# Patient Record
Sex: Female | Born: 1975 | Race: White | Hispanic: No | Marital: Married | Smoking: Former smoker
Health system: Southern US, Community
[De-identification: ages and names within clinical notes are randomized; demographics above are authoritative.]

## PROBLEM LIST (undated history)

## (undated) DIAGNOSIS — N946 Dysmenorrhea, unspecified: Secondary | ICD-10-CM

## (undated) DIAGNOSIS — K219 Gastro-esophageal reflux disease without esophagitis: Secondary | ICD-10-CM

## (undated) DIAGNOSIS — I10 Essential (primary) hypertension: Secondary | ICD-10-CM

## (undated) DIAGNOSIS — E0590 Thyrotoxicosis, unspecified without thyrotoxic crisis or storm: Secondary | ICD-10-CM

## (undated) DIAGNOSIS — E785 Hyperlipidemia, unspecified: Secondary | ICD-10-CM

## (undated) DIAGNOSIS — E890 Postprocedural hypothyroidism: Secondary | ICD-10-CM

## (undated) DIAGNOSIS — N8003 Adenomyosis of the uterus: Secondary | ICD-10-CM

## (undated) DIAGNOSIS — T7840XA Allergy, unspecified, initial encounter: Secondary | ICD-10-CM

## (undated) DIAGNOSIS — N8000 Endometriosis of the uterus, unspecified: Secondary | ICD-10-CM

## (undated) DIAGNOSIS — N920 Excessive and frequent menstruation with regular cycle: Secondary | ICD-10-CM

## (undated) DIAGNOSIS — Z8639 Personal history of other endocrine, nutritional and metabolic disease: Secondary | ICD-10-CM

## (undated) HISTORY — DX: Allergy, unspecified, initial encounter: T78.40XA

## (undated) HISTORY — PX: COLONOSCOPY: SHX174

## (undated) HISTORY — DX: Hyperlipidemia, unspecified: E78.5

## (undated) HISTORY — DX: Essential (primary) hypertension: I10

## (undated) HISTORY — PX: TUBAL LIGATION: SHX77

## (undated) HISTORY — DX: Gastro-esophageal reflux disease without esophagitis: K21.9

---

## 2006-05-15 ENCOUNTER — Ambulatory Visit (HOSPITAL_COMMUNITY): Admission: RE | Admit: 2006-05-15 | Discharge: 2006-05-15 | Payer: Self-pay | Admitting: Obstetrics and Gynecology

## 2006-07-17 ENCOUNTER — Inpatient Hospital Stay (HOSPITAL_COMMUNITY): Admission: AD | Admit: 2006-07-17 | Discharge: 2006-07-20 | Payer: Self-pay | Admitting: Obstetrics and Gynecology

## 2006-08-23 ENCOUNTER — Ambulatory Visit (HOSPITAL_COMMUNITY): Admission: RE | Admit: 2006-08-23 | Discharge: 2006-08-23 | Payer: Self-pay | Admitting: Obstetrics and Gynecology

## 2006-08-23 HISTORY — PX: LAPAROSCOPIC TUBAL LIGATION: SUR803

## 2008-02-12 ENCOUNTER — Emergency Department (HOSPITAL_COMMUNITY): Admission: EM | Admit: 2008-02-12 | Discharge: 2008-02-12 | Payer: Self-pay | Admitting: Emergency Medicine

## 2008-07-20 ENCOUNTER — Emergency Department (HOSPITAL_COMMUNITY): Admission: EM | Admit: 2008-07-20 | Discharge: 2008-07-21 | Payer: Self-pay | Admitting: Emergency Medicine

## 2009-06-29 ENCOUNTER — Encounter: Admission: RE | Admit: 2009-06-29 | Discharge: 2009-06-29 | Payer: Self-pay | Admitting: Internal Medicine

## 2010-01-04 LAB — HM COLONOSCOPY

## 2010-08-19 LAB — URINALYSIS, ROUTINE W REFLEX MICROSCOPIC
Glucose, UA: NEGATIVE mg/dL
Hgb urine dipstick: NEGATIVE
Ketones, ur: NEGATIVE mg/dL
Nitrite: NEGATIVE
Protein, ur: 30 mg/dL — AB
Specific Gravity, Urine: 1.025 (ref 1.005–1.030)
pH: 5.5 (ref 5.0–8.0)

## 2010-08-19 LAB — POCT I-STAT, CHEM 8
BUN: 8 mg/dL (ref 6–23)
Calcium, Ion: 1.12 mmol/L (ref 1.12–1.32)
Creatinine, Ser: 0.9 mg/dL (ref 0.4–1.2)
Glucose, Bld: 191 mg/dL — ABNORMAL HIGH (ref 70–99)
Hemoglobin: 17 g/dL — ABNORMAL HIGH (ref 12.0–15.0)
Sodium: 139 mEq/L (ref 135–145)
TCO2: 23 mmol/L (ref 0–100)

## 2010-08-19 LAB — URINE MICROSCOPIC-ADD ON

## 2010-09-24 NOTE — Discharge Summary (Signed)
NAMEANDERIA, Connie NO.:  [id number]   MEDICAL RECORD NO.:  [id number]          PATIENT TYPE:  INP   LOCATION:                                FACILITY:  [facility]   PHYSICIAN:  Duke Salvia. Marcelle Overlie, M.D.DATE OF BIRTH:  06-12-1975   DATE OF ADMISSION:  DATE OF DISCHARGE:  07/20/2006                               DISCHARGE SUMMARY   ADMITTING DIAGNOSES:  1. Intrauterine pregnancy at term.  2. Breech presentation.  3. Elevation in maternal blood pressure.   DISCHARGE DIAGNOSES:  1. Status post low transverse cesarean section.  2. Viable female infant.   PROCEDURE:  Primary low transverse cesarean section.   REASON FOR ADMISSION:  Please see written H&P.   HOSPITAL COURSE:  The patient is 35 year old primigravida who was  admitted to [facility] for scheduled cesarean section.  Baby was known to be in the breech presentation.  The patient also had  been seen in the office for blood pressure was noted to be 140/90s with  1+ proteinuria.  PIH panel was drawn which was within normal limits.  Decision was made to proceed with a primary low transverse cesarean  section.  The patient was then transferred to operating room where  spinal anesthesia was administered without difficulty.  A low transverse  incision was made with delivery of a viable female infant weighing 7  pounds 1 ounce.  Apgars of 9 at 1 minutes, 10 at 5 minutes.  The patient  tolerated procedure well and taken to the recovery room in stable  condition.  On postoperative day #1 the patient was without complaint.  She denied dizziness, headache or blurred vision.  Vital signs were  stable.  Blood pressure 111/66 to 108/64.  Abdomen soft.  Fundus firm  and nontender.  Abdominal dressing was noted to be clean, dry and  intact. Laboratory findings revealed hemoglobin of 7.6, platelet count  of 220,000, WBC count of 13.7. CBC was drawn in approximately 6  additional hours and hemoglobin was  found to be stable at 7.4. On  postoperative day #2 the patient was without complaint.  She denied  dizziness or headache or blurred vision.  Vital signs were stable.  Blood pressure 130/91 to 119/80.  Deep tendon reflexes were 1+ without  clonus.  Abdomen soft with good return of bowel function.  Fundus firm  and nontender.  Abdominal dressing had been removed revealing an  incision that was clean, dry and intact.  The patient was ambulating  well.  NPIH labs were scheduled for in the morning. On postoperative day  #3 the patient without complaint.  Denied dizziness, headache or blurred  vision.  Vital signs were stable.  Blood pressure stable.  Abdomen soft.  Fundus firm and nontender.  Incision was clean, dry and intact.  Laboratory findings revealed hemoglobin of 6.7, platelet count of  240,000.  Liver function tests were within normal limits.  Discharge  instructions were reviewed and the patient was later discharged home.   CONDITION ON DISCHARGE:  Stable. Diet regular as tolerated.   ACTIVITY:  No heavy lifting, no  driving x2 weeks, no vaginal entry.   FOLLOW UP:  Patient to follow up in the office in 2-3 days for blood  pressure check and incision check.  She is to call for temperature  greater than 100 degrees, persistent nausea, vomiting, heavy vaginal  bleeding and/or redness or drainage from incisional site.  The patient  was also instructed to call for headache, blurred vision or right upper  quadrant pain.   DISCHARGE MEDICATIONS:  1. Ferrous sulfate 325 mg one p.o. b.i.d.  2. Percocet 5 access 325 #30 1 p.o. b.i.d. 4-6 p.r.n.  3. Motrin 600 mg every 6 hours.  4. Prenatal vitamins one p.o. daily.      Julio Sicks, N.P.      Richard M. Marcelle Overlie, M.D.  Electronically Signed    CC/MEDQ  D:  08/14/2006  T:  08/14/2006  Job:  [id number]

## 2010-09-24 NOTE — Op Note (Signed)
NAMESRAVYA, [name] NO.:  [id number]   MEDICAL RECORD NO.:  [id number]          PATIENT TYPE:  INP   LOCATION:  9146                          [facility]ACILITY:  [facility]   PHYSICIAN:  Juluis Mire, M.D.   DATE OF BIRTH:  03/04/1976   DATE OF PROCEDURE:  07/17/2006  DATE OF DISCHARGE:                               OPERATIVE REPORT   PREOPERATIVE DIAGNOSIS:  Intrauterine pregnancy at 37 weeks with mild  pregnancy-induced hypertension and breech presentation.   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   OPERATIVE PROCEDURE:  Was low transverse cesarean section.   SURGEON:  Juluis Mire, M.D.   ANESTHESIA:  Spinal.   ESTIMATED BLOOD LOSS:  500-600 mL.   PACKS AND DRAINS:  None.   BLOOD REPLACED:  None.   COMPLICATIONS:  Were none.   INDICATIONS:  A 35 year old primigravida female at 58 weeks.  Presented  to the office with blood pressures of 140/90 and 1+ proteinuria.  She  was sent to triage were she continued to have elevated blood pressure.  Her laboratories were within normal limits. Ultrasound confirmed a  persistent breech presentation.  In view of the elevated blood pressures  at term with breech presentation decision was to proceed with primary  cesarean section.  The risks were discussed with the risk of infection,  the risks of hemorrhage could require transfusion with the risk of AIDS  or hepatitis, the risk of injury to adjacent organs including bladder,  bowel, ureters that could require further exploratory surgery, risk of  deep venous thrombosis and pulmonary embolus, the risk of prematurity  also discussed.   DESCRIPTION OF PROCEDURE:  The patient was taken to OR, placed supine  position with left lateral tilt.  After satisfactory level of spinal  anesthesia obtained, abdomen was  prepped out with Betadine and draped in  sterile field.  A low transverse skin incision made with knife.  The  incision was extended through subcutaneous tissue.  Fascia was entered  sharply and incision to fascia extended laterally.  Fascia taken off the  muscles superiorly and inferiorly.  Rectus muscles were separated in  midline.  The peritoneum was entered sharply and incision of perineum  extended both superiorly and inferiorly.  Low transverse bladder flap  was developed, a low transverse uterine incision begun with a knife and  extended laterally using manual traction.  The infant presented in the  breech presentation and was delivered in usual manner.  The infant was a  viable female.  Apgars were 9/10.  Baby weighed 7 pounds 1 ounce.  PH  was 7.34.  Placenta was delivered manually and sent for review.  Uterus  was exteriorized for closure.  At this point time she had a unicornuate  uterus with rudimentary left knee uterine horn. She had ovaries and  tubes on both sides.  At this point in time uterus closed with locking  suture 0 chromic using two-layer closure technique.  We had good  hemostasis and clear urine output.  Uterus returned to abdominal cavity.  We irrigated the pelvis.  The peritoneum was closed with a suture of 3-0  Vicryl.  Fascia was closed running suture of 0 PDS.  Skin was closed  staples and Steri-Strips. Sponge, instrument, and needle count was  correct by circulating nurse x2.  Foley catheter remained clear at time  of closure.  The patient tolerated the procedure well and returned to  the recovery room in good condition.      Juluis Mire, M.D.  Electronically Signed     JSM/MEDQ  D:  07/17/2006  T:  07/17/2006  Job:  [id number]

## 2010-09-24 NOTE — H&P (Signed)
NAME:  [name], [name] NO.:  [id number]   MEDICAL RECORD NO.:  [id number]          PATIENT TYPE:  AMB   LOCATION:  SDC                           [facility]:  [facility]   PHYSICIAN:  Juluis Mire, M.D.   DATE OF BIRTH:  April 09, 1976   DATE OF ADMISSION:  DATE OF DISCHARGE:                              HISTORY & PHYSICAL   The patient is a 35 year old, gravida 1, para 1, married female who  presents for bilateral tubal ligation.  Alternatives and forms of birth  control have been discussed.  The potential irreversibility of  sterilization was explained.  A failure rate of 1 in 200 is quoted.  It  can be in the form of ectopic pregnancy requiring further surgical  management.   PAST MEDICAL HISTORY:  Usual childhood diseases.  No significant  sequelae.   PAST SURGICAL HISTORY:  She had a recent cesarean section.   FAMILY HISTORY:  Noncontributory.   SOCIAL HISTORY:  No tobacco or alcohol use.   REVIEW OF SYSTEMS:  Noncontributory.   PHYSICAL EXAMINATION:  GENERAL:  The patient is afebrile.  Stable vital  signs.  HEENT:  The patient is normocephalic.  Pupils equal, round, and reactive  to light and accommodation.  Extraocular movements were intact.  Sclerae  and conjunctivae clear.  Oropharynx clear.  NECK:  Without thyromegaly.  BREASTS:  Glandular but no discrete masses.  ABDOMEN:  Well-healed low transverse incision.  No masses or  organomegaly.  PELVIC:  Normal external genitalia.  Vaginal mucosa is clear.  Cervix  unremarkable.  Uterus of normal size and shape.  Adnexa free of masses  or tenderness.  EXTREMITIES:  Trace edema.  NEUROLOGIC:  Grossly within normal limits.   IMPRESSION:  Desires sterility.   PLAN:  The patient will undergo laparoscopic bilateral tubal ligation.  The risks of surgery have been discussed including the risk of  infection, the risk of hemorrhage that could require transfusion, the  risk of AIDS or hepatitis, risk of injury to  adjacent organs including  bladder, bowel, or ureters that could require further exploratory  surgery, risk of deep venous thrombosis and pulmonary emboli.  The  patient voices understanding of risk and issues.      Juluis Mire, M.D.  Electronically Signed    JSM/MEDQ  D:  08/23/2006  T:  08/23/2006  Job:  [id number]

## 2010-09-24 NOTE — Op Note (Signed)
NAMEMARIDEE, SLAPE              ACCOUNT NO.:  [id number]   MEDICAL RECORD NO.:  [id number]          PATIENT TYPE:  AMB   LOCATION:  SDC                           [facility]   PHYSICIAN:  Juluis Mire, M.D.   DATE OF BIRTH:  1976/04/19   DATE OF PROCEDURE:  08/23/2006  DATE OF DISCHARGE:                               OPERATIVE REPORT   PREOPERATIVE DIAGNOSIS:  Desires sterility.   POSTOPERATIVE DIAGNOSIS:  Desires sterility.   PROCEDURE:  Laparoscopy with bilateral tubal fulguration.   SURGEON:  Juluis Mire, M.D.   ANESTHESIA:  General.   ESTIMATED BLOOD LOSS:  Minimal.   PACKS AND DRAINS:  None.   INTRAOPERATIVE BLOOD PLACED:  None.   COMPLICATIONS:  None.   INDICATIONS:  Dictated in history and physical.   DESCRIPTION OF PROCEDURE:  Patient was taken to the OR and placed in the  supine position.  After satisfactory level of general endotracheal  anesthesia obtained, the patient was placed in the dorsal lithotomy  position using the Allen stirrups.  The abdomen, perineum and vagina  were prepped out with Betadine.  Bladder was emptied by in-and-out  catheterization.  The Hulka tenaculum was put in place and secured.  Patient draped in sterile field.  Subumbilical incision made with a  knife.  The Veress needle was introduced in the abdominal cavity.  Abdomen inflated with approximately 3 liters of carbon dioxide.  The  operating laparoscope was then introduced.  There was no evidence of  injury to adjacent organs.  We did put a 5 mm trocar in the suprapubic  area.  Visualization revealed normal appendix.  Upper abdomen including  liver and tip of the gallbladder were clear.  Both lateral gutters were  clear.  She had a unicorned uterus with a rudimentary horn on the left.  Left have a rudimentary tube, ovary appeared to be normal.  She had a  functional cyst on the left ovary, right ovary was clear.  Next, using  the bipolar, mid segment of each tube was  identified and cauterized for  a distance of approximately 3 cm.  Coagulation was continued to  resistance read 0 on the meter.  We then recoagulated the same segments  of tube completely desiccating the tube.  The cautery did extend out  into the mesosalpinx.  At the end of procedure we had good hemostasis.  Both tubes were adequately coagulated.  We emptied the abdomen of its  carbon dioxide.  All trocars removed.  Subumbilical incision was closed  interrupted subcuticulars of 4-0 Vicryl.  The suprapubic incision was  closed with Dermabond.  At this point in time the Hulka tenaculum was  then removed.  The patient was taken out of the dorsal position.  Once  alert and extubated, transferred to the recovery room in good condition.  Sponge, instrument and needle count reported as correct by circulating  nurse.      Juluis Mire, M.D.  Electronically Signed     JSM/MEDQ  D:  08/23/2006  T:  08/23/2006  Job:  [id number]

## 2011-03-10 ENCOUNTER — Ambulatory Visit (HOSPITAL_COMMUNITY): Admission: RE | Admit: 2011-03-10 | Payer: Self-pay | Source: Ambulatory Visit | Admitting: Obstetrics and Gynecology

## 2011-03-10 ENCOUNTER — Encounter (HOSPITAL_COMMUNITY): Admission: RE | Payer: Self-pay | Source: Ambulatory Visit

## 2011-03-10 SURGERY — HYSTERECTOMY, VAGINAL, LAPAROSCOPY-ASSISTED
Anesthesia: General

## 2013-01-14 ENCOUNTER — Other Ambulatory Visit: Payer: Self-pay | Admitting: Family Medicine

## 2013-01-14 ENCOUNTER — Ambulatory Visit (INDEPENDENT_AMBULATORY_CARE_PROVIDER_SITE_OTHER): Payer: BC Managed Care – PPO | Admitting: Family Medicine

## 2013-01-14 ENCOUNTER — Ambulatory Visit: Payer: BC Managed Care – PPO

## 2013-01-14 VITALS — BP 110/70 | HR 93 | Temp 99.6°F | Resp 16 | Ht 64.2 in | Wt 166.8 lb

## 2013-01-14 DIAGNOSIS — R221 Localized swelling, mass and lump, neck: Secondary | ICD-10-CM

## 2013-01-14 DIAGNOSIS — R635 Abnormal weight gain: Secondary | ICD-10-CM

## 2013-01-14 DIAGNOSIS — R220 Localized swelling, mass and lump, head: Secondary | ICD-10-CM

## 2013-01-14 DIAGNOSIS — J029 Acute pharyngitis, unspecified: Secondary | ICD-10-CM

## 2013-01-14 DIAGNOSIS — R059 Cough, unspecified: Secondary | ICD-10-CM

## 2013-01-14 DIAGNOSIS — R05 Cough: Secondary | ICD-10-CM

## 2013-01-14 LAB — POCT CBC
Granulocyte percent: 60.8 % (ref 37–80)
HCT, POC: 42.9 % (ref 37.7–47.9)
Hemoglobin: 13.7 g/dL (ref 12.2–16.2)
Lymph, poc: 3 (ref 0.6–3.4)
MCH, POC: 30.1 pg (ref 27–31.2)
MCHC: 31.9 g/dL (ref 31.8–35.4)
MCV: 94.3 fL (ref 80–97)
MID (cbc): 0.6 (ref 0–0.9)
MPV: 7.7 fL (ref 0–99.8)
POC Granulocyte: 5.6 (ref 2–6.9)
POC LYMPH PERCENT: 32.4 %L (ref 10–50)
POC MID %: 6.8 % (ref 0–12)
Platelet Count, POC: 287 10*3/uL (ref 142–424)
RBC: 4.55 M/uL (ref 4.04–5.48)
RDW, POC: 13.2 %
WBC: 9.2 10*3/uL (ref 4.6–10.2)

## 2013-01-14 LAB — POCT RAPID STREP A (OFFICE): Rapid Strep A Screen: NEGATIVE

## 2013-01-14 MED ORDER — AMOXICILLIN 875 MG PO TABS: 875.0000 mg | ORAL_TABLET | Freq: Two times a day (BID) | ORAL | Status: DC

## 2013-01-14 MED ORDER — HYDROCODONE-HOMATROPINE 5-1.5 MG/5ML PO SYRP: 5.0000 mL | ORAL_SOLUTION | Freq: Every evening | ORAL | Status: DC | PRN

## 2013-01-14 NOTE — Progress Notes (Signed)
[facility]:  Office Visit  Chief Complaint:  Chief Complaint  Patient presents with  . knot on front of neck    noticed this morning, hurts when swallows, pain when touch    HPI: [name] is a 37 y.o. female who complains of 1 day history of neck nodule.  She is here because has had difficulty swallowing, Denies fevers or chills, + sore throat. Denies facial or ear pain, + clear cough.  10 lb weigth gain in 2-3 months , quit smoking 3 months ago.  No drainage, + clears her throat alot, PND.  No recent travels , + sick contact, Her husbnad was sick with URI sxs Denies SOB, CP, wheezing, voice changes  History reviewed. No pertinent past medical history. Past Surgical History  Procedure Laterality Date  . Cesarean section     History   Social History  . Marital Status: Single    Spouse Name: N/A    Number of Children: N/A  . Years of Education: N/A   Social History Main Topics  . Smoking status: Former Games developer  . Smokeless tobacco: Former Neurosurgeon    Quit date: 10/14/2012  . Alcohol Use: 3.0 oz/week    6 drink(s) per week  . Drug Use: None  . Sexual Activity: Yes    Birth Control/ Protection: None   Other Topics Concern  . None   Social History Narrative  . None   Family History  Problem Relation Age of Onset  . Diabetes Father    No Known Allergies Prior to Admission medications   Not on File     ROS: The patient denies fevers, chills, night sweats, unintentional weight loss, chest pain, palpitations, wheezing, dyspnea on exertion, nausea, vomiting, abdominal pain, dysuria, hematuria, melena, numbness, weakness, or tingling.   All other systems have been reviewed and were otherwise negative with the exception of those mentioned in the HPI and as above.    PHYSICAL EXAM: Filed Vitals:   01/14/13 1306  BP: 110/70  Pulse: 93  Temp: 99.6 F (37.6 C)  Resp: 16   Filed Vitals:   01/14/13 1306  Height: 5' 4.2" (1.631 m)  Weight:  166 lb 12.8 oz (75.66 kg)   Body mass index is 28.44 kg/(m^2).  General: Alert, no acute distress HEENT:  Normocephalic, atraumatic, oropharynx patent. EOMI, PERRLA + soft mobile throat nodule at cricoid. Tm nl. No exudates, tonsils nl. No facial tenderness.  Cardiovascular:  Regular rate and rhythm, no rubs murmurs or gallops.  No Carotid bruits, radial pulse intact. No pedal edema.  Respiratory: Clear to auscultation bilaterally.  No wheezes, rales, or rhonchi.  No cyanosis, no use of accessory musculature GI: No organomegaly, abdomen is soft and non-tender, positive bowel sounds.  No masses. Skin: No rashes. Neurologic: Facial musculature symmetric. Psychiatric: Patient is appropriate throughout our interaction. Lymphatic: No cervical lymphadenopathy Musculoskeletal: Gait intact.     LABS: Results for orders placed in visit on 01/14/13  POCT CBC      Result Value Range   WBC 9.2  4.6 - 10.2 K/uL   Lymph, poc 3.0  0.6 - 3.4   POC LYMPH PERCENT 32.4  10 - 50 %L   MID (cbc) 0.6  0 - 0.9   POC MID % 6.8  0 - 12 %M   POC Granulocyte 5.6  2 - 6.9   Granulocyte percent 60.8  37 - 80 %G   RBC 4.55  4.04 - 5.48 M/uL  Hemoglobin 13.7  12.2 - 16.2 g/dL   HCT, POC 16.1  09.6 - 47.9 %   MCV 94.3  80 - 97 fL   MCH, POC 30.1  27 - 31.2 pg   MCHC 31.9  31.8 - 35.4 g/dL   RDW, POC 04.5     Platelet Count, POC 287  142 - 424 K/uL   MPV 7.7  0 - 99.8 fL  POCT RAPID STREP A (OFFICE)      Result Value Range   Rapid Strep A Screen Negative  Negative     EKG/XRAY:   Primary read interpreted by Dr. Conley Rolls at [facility]. No fx/dislocation No obvious soft tissue mass or edema or air    ASSESSMENT/PLAN: Encounter Diagnoses  Name Primary?  . Neck nodule Yes  . Pharyngitis   . Weight gain   . Cough    Rx Amoxacillin Rx Hydromet Throat cx pending F/u in  7 days, if no improvement then will get [place] of neck Work note given Go to ER prn Gross sideeffects, risk and benefits, and alternatives  of medications d/w patient. Patient is aware that all medications have potential sideeffects and we are unable to predict every sideeffect or drug-drug interaction that may occur.  Connie Cuccia PHUONG, [name] 01/14/2013 3:47 PM    LM to inquire how she is doing, throat cx negative so far.

## 2013-01-15 LAB — TSH: TSH: <0.008 u[IU]/mL — ABNORMAL LOW (ref 0.350–4.500)

## 2013-01-16 ENCOUNTER — Encounter: Payer: Self-pay | Admitting: Family Medicine

## 2013-01-16 LAB — CULTURE, GROUP A STREP: Organism ID, Bacteria: NORMAL

## 2013-01-17 ENCOUNTER — Telehealth: Payer: Self-pay | Admitting: *Deleted

## 2013-01-17 ENCOUNTER — Other Ambulatory Visit: Payer: Self-pay | Admitting: Family Medicine

## 2013-01-17 ENCOUNTER — Telehealth: Payer: Self-pay

## 2013-01-17 DIAGNOSIS — R221 Localized swelling, mass and lump, neck: Secondary | ICD-10-CM

## 2013-01-17 DIAGNOSIS — R7989 Other specified abnormal findings of blood chemistry: Secondary | ICD-10-CM

## 2013-01-17 LAB — T4, FREE: Free T4: 1.5 ng/dL (ref 0.80–1.80)

## 2013-01-17 LAB — T3: T3, Total: 191 ng/dL (ref 80.0–204.0)

## 2013-01-17 NOTE — Telephone Encounter (Signed)
Called and informed patient of abnormal TSH level and that Dr. Conley Rolls would like to add additional lab tests and schedule and ultrasound. Patient verbalized understanding. Patient was also asked how she was feeling and she stated that the lump in her neck is smaller, but she still feels fatigued.

## 2013-01-17 NOTE — Telephone Encounter (Signed)
Called [place] and spoke with [place]. Per Dr. Conley Rolls, added on a Free T4, T3, and a thyroid peroxidase antibody.

## 2013-01-18 ENCOUNTER — Ambulatory Visit
Admission: RE | Admit: 2013-01-18 | Discharge: 2013-01-18 | Disposition: A | Discharge status: Home / Self Care | Payer: PRIVATE HEALTH INSURANCE | Source: Ambulatory Visit | Attending: Family Medicine | Admitting: Family Medicine

## 2013-01-18 ENCOUNTER — Telehealth: Payer: Self-pay | Admitting: Family Medicine

## 2013-01-18 DIAGNOSIS — R221 Localized swelling, mass and lump, neck: Secondary | ICD-10-CM

## 2013-01-18 LAB — THYROID PEROXIDASE ANTIBODY: Thyroperoxidase Ab SerPl-aCnc: <10 IU/mL (ref <35.0)

## 2013-01-18 NOTE — Telephone Encounter (Signed)
Spoke to patient about added labs and also [place] results

## 2013-01-24 ENCOUNTER — Other Ambulatory Visit: Payer: Self-pay | Admitting: Family Medicine

## 2013-01-24 DIAGNOSIS — E041 Nontoxic single thyroid nodule: Secondary | ICD-10-CM

## 2013-06-04 ENCOUNTER — Ambulatory Visit: Payer: BC Managed Care – PPO | Admitting: Family

## 2013-06-13 ENCOUNTER — Ambulatory Visit (INDEPENDENT_AMBULATORY_CARE_PROVIDER_SITE_OTHER): Payer: Commercial Managed Care - PPO | Admitting: Family

## 2013-06-13 ENCOUNTER — Encounter (INDEPENDENT_AMBULATORY_CARE_PROVIDER_SITE_OTHER): Payer: Self-pay

## 2013-06-13 ENCOUNTER — Encounter: Payer: Self-pay | Admitting: Family

## 2013-06-13 VITALS — BP 122/82 | HR 80 | Temp 98.2°F | Resp 16 | Ht 62.0 in | Wt 166.0 lb

## 2013-06-13 DIAGNOSIS — E041 Nontoxic single thyroid nodule: Secondary | ICD-10-CM

## 2013-06-13 LAB — CBC WITH DIFFERENTIAL/PLATELET
BASOS ABS: 0 10*3/uL (ref 0.0–0.1)
Basophils Relative: 0.4 % (ref 0.0–3.0)
EOS ABS: 0.3 10*3/uL (ref 0.0–0.7)
EOS PCT: 3.5 % (ref 0.0–5.0)
HEMATOCRIT: 41.8 % (ref 36.0–46.0)
Hemoglobin: 14 g/dL (ref 12.0–15.0)
LYMPHS ABS: 1.8 10*3/uL (ref 0.7–4.0)
Lymphocytes Relative: 22.7 % (ref 12.0–46.0)
MCHC: 33.6 g/dL (ref 30.0–36.0)
MCV: 90.7 fl (ref 78.0–100.0)
Monocytes Absolute: 0.5 10*3/uL (ref 0.1–1.0)
Monocytes Relative: 7 % (ref 3.0–12.0)
Neutro Abs: 5.2 10*3/uL (ref 1.4–7.7)
Neutrophils Relative %: 66.4 % (ref 43.0–77.0)
PLATELETS: 281 10*3/uL (ref 150.0–400.0)
RBC: 4.61 Mil/uL (ref 3.87–5.11)
RDW: 12.9 % (ref 11.5–14.6)
WBC: 7.8 10*3/uL (ref 4.5–10.5)

## 2013-06-13 LAB — T3, FREE: T3 FREE: 4 pg/mL (ref 2.3–4.2)

## 2013-06-13 LAB — TSH: TSH: 0.06 u[IU]/mL — ABNORMAL LOW (ref 0.35–5.50)

## 2013-06-13 LAB — T4, FREE: Free T4: 0.97 ng/dL (ref 0.60–1.60)

## 2013-06-13 NOTE — Patient Instructions (Signed)
1. Refer to Endocrinology 2. Biopsy Thyroid 3. Follow-up for CPX

## 2013-06-13 NOTE — Progress Notes (Signed)
Pre visit review using our clinic review tool, if applicable. No additional management support is needed unless otherwise documented below in the visit note. 

## 2013-06-13 NOTE — Progress Notes (Signed)
   Subjective:    Patient ID: [name], female    DOB: 21-Mar-1976, 38 y.o.   MRN: [id number]  HPI 38 year old white female, nonsmoker, is in today to be established. She has a history of a thyroid nodule that was identified in September 2014. Due to lack of insurance, she reports that she can followup on the lesion for biopsy. She is here today to get that scheduled. Had a normal thyroid panel at that time. She denies any chest pain, palpitations, heat or cold intolerance. Last physical unknown.   Review of Systems  Constitutional: Negative.   HENT: Negative.   Respiratory: Negative.   Cardiovascular: Negative.   Gastrointestinal: Negative.   Endocrine: Negative.   Genitourinary: Negative.   Musculoskeletal: Negative.   Skin: Negative.   Neurological: Negative.   Hematological: Negative.   Psychiatric/Behavioral: Negative.        Objective:   Physical Exam  Constitutional: She is oriented to person, place, and time. She appears well-developed and well-nourished.  HENT:  Head: Normocephalic.  Right Ear: External ear normal.  Left Ear: External ear normal.  Nose: Nose normal.  Mouth/Throat: Oropharynx is clear and moist.  Eyes: Conjunctivae are normal. Pupils are equal, round, and reactive to light.  Neck: Normal range of motion. Neck supple.  Cardiovascular: Normal rate, regular rhythm and normal heart sounds.   Pulmonary/Chest: Effort normal and breath sounds normal.  Abdominal: Soft. Bowel sounds are normal.  Musculoskeletal: Normal range of motion.  Neurological: She is alert and oriented to person, place, and time. She has normal reflexes.  Skin: Skin is warm and dry.  Psychiatric: She has a normal mood and affect.          Assessment & Plan:  [name] was seen today for new pt to establish and thyroid nodule.  Diagnoses and associated orders for this visit:  Thyroid nodule - US Thyroid Biopsy; Future - TSH - T3, Free - T4, Free - CBC with  Differential    will follow the patient pending results. Refer to endocrinology.

## 2013-06-13 NOTE — Progress Notes (Signed)
   Subjective:    Patient ID: [name], female    DOB: [birth year], 38 y.o.   MRN: [id number]  HPI    Review of Systems     Objective:   Physical Exam  Neck: Thyromegaly present.  Palpable mass on the thyroid. Nontender.           Assessment & Plan:

## 2013-06-19 ENCOUNTER — Ambulatory Visit: Payer: Commercial Managed Care - PPO | Admitting: Endocrinology

## 2013-06-25 ENCOUNTER — Ambulatory Visit
Admission: RE | Admit: 2013-06-25 | Discharge: 2013-06-25 | Disposition: A | Discharge status: Home / Self Care | Payer: Commercial Managed Care - PPO | Source: Ambulatory Visit | Attending: Family | Admitting: Family

## 2013-06-25 ENCOUNTER — Other Ambulatory Visit (HOSPITAL_COMMUNITY)
Admission: RE | Admit: 2013-06-25 | Discharge: 2013-06-25 | Disposition: A | Discharge status: Home / Self Care | Payer: Commercial Managed Care - PPO | Source: Ambulatory Visit | Attending: Interventional Radiology | Admitting: Interventional Radiology

## 2013-06-25 DIAGNOSIS — E049 Nontoxic goiter, unspecified: Secondary | ICD-10-CM | POA: Insufficient documentation

## 2013-06-25 DIAGNOSIS — E041 Nontoxic single thyroid nodule: Secondary | ICD-10-CM

## 2013-06-28 ENCOUNTER — Telehealth: Payer: Self-pay | Admitting: Family

## 2013-06-28 ENCOUNTER — Other Ambulatory Visit: Payer: Self-pay | Admitting: Family

## 2013-06-28 DIAGNOSIS — E0789 Other specified disorders of thyroid: Secondary | ICD-10-CM

## 2013-06-28 NOTE — Telephone Encounter (Signed)
Pt returning your call. pls call °

## 2013-07-01 NOTE — Telephone Encounter (Signed)
Spoke with pt about [place] and she has an appt with Endo tomorrow

## 2013-07-02 ENCOUNTER — Encounter: Payer: Self-pay | Admitting: Endocrinology

## 2013-07-02 ENCOUNTER — Ambulatory Visit (INDEPENDENT_AMBULATORY_CARE_PROVIDER_SITE_OTHER): Payer: Commercial Managed Care - PPO | Admitting: Endocrinology

## 2013-07-02 VITALS — BP 118/82 | HR 93 | Temp 97.7°F | Ht 62.0 in | Wt 168.0 lb

## 2013-07-02 DIAGNOSIS — E0590 Thyrotoxicosis, unspecified without thyrotoxic crisis or storm: Secondary | ICD-10-CM

## 2013-07-02 DIAGNOSIS — R0600 Dyspnea, unspecified: Secondary | ICD-10-CM | POA: Insufficient documentation

## 2013-07-02 DIAGNOSIS — E079 Disorder of thyroid, unspecified: Secondary | ICD-10-CM | POA: Insufficient documentation

## 2013-07-02 DIAGNOSIS — R0609 Other forms of dyspnea: Secondary | ICD-10-CM

## 2013-07-02 DIAGNOSIS — R0989 Other specified symptoms and signs involving the circulatory and respiratory systems: Secondary | ICD-10-CM

## 2013-07-02 NOTE — Progress Notes (Signed)
   Subjective:    Patient ID: [name], female    DOB: 1975-07-14, 38 y.o.   MRN: [id number]  HPI Pt states few mos of moderate swelling at the anterior neck, and slight assoc pain. Past Medical History  Diagnosis Date  . Thyroid nodule     Past Surgical History  Procedure Laterality Date  . Cesarean section    . Abdominal hysterectomy  2008    History   Social History  . Marital Status: Single    Spouse Name: N/A    Number of Children: N/A  . Years of Education: N/A   Occupational History  . Not on file.   Social History Main Topics  . Smoking status: Former Smoker -- 2.00 packs/day    Types: Cigarettes  . Smokeless tobacco: Former Systems developer    Quit date: 10/14/2012  . Alcohol Use: 3.0 oz/week    6 drink(s) per week  . Drug Use: No  . Sexual Activity: Yes    Birth Control/ Protection: None   Other Topics Concern  . Not on file   Social History Narrative  . No narrative on file    No current outpatient prescriptions on file prior to visit.   No current facility-administered medications on file prior to visit.    No Known Allergies  Family History  Problem Relation Age of Onset  . Diabetes Father   . Heart disease Father   . Hypertension Father   . Cancer Maternal Grandmother     BP 118/82  Pulse 93  Temp(Src) 97.7 F (36.5 C) (Oral)  Ht 5\' 2"  (1.575 m)  Wt 168 lb (76.204 kg)  BMI 30.72 kg/m2  SpO2 97%  Review of Systems She has weight gain, mild sob, headache, palpitations, easy bruising, myalgias, anxiety, and dizziness.  denies hoarseness, double vision, polyuria, excessive diaphoresis, numbness, tremor, easy bruising, and rhinorrhea.  She attributes diarrhea to colitis.  She has regular menses.    Objective:   Physical Exam VS: see vs page GEN: no distress HEAD: head: no deformity eyes: no periorbital swelling, no proptosis external nose and ears are normal mouth: no lesion seen NECK: the thyroid mass seen on [place] is easily  palpable CHEST WALL: no deformity LUNGS:  Clear to auscultation CV: reg rate and rhythm, no murmur MUSCULOSKELETAL: muscle bulk and strength are grossly normal.  no obvious joint swelling.  gait is normal and steady EXTEMITIES: no deformity.    no edema NEURO:  cn 2-12 grossly intact.   readily moves all 4's.  sensation is intact to touch on all 4's.  No tremor. SKIN:  Normal texture and temperature.  No rash or suspicious lesion is visible.   NODES:  None palpable at the neck PSYCH: alert, well-oriented.  Does not appear anxious nor depressed.   Spirometry is normal Lab Results  Component Value Date   TSH 0.06* 06/13/2013   T3TOTAL 191.0 01/14/2013   (i reviewed thyroid US result)    Assessment & Plan:  Thyroid mass, apparently new.  Hyperfunction probably accounts for the abnormal cytology, rather than malignancy. Hyperthyroidism: probably due to the nodule. Sob: not thyroid-related.

## 2013-07-02 NOTE — Patient Instructions (Signed)

## 2013-07-03 ENCOUNTER — Other Ambulatory Visit: Payer: Commercial Managed Care - PPO

## 2013-07-08 ENCOUNTER — Ambulatory Visit: Payer: Commercial Managed Care - PPO | Admitting: Endocrinology

## 2013-07-10 ENCOUNTER — Ambulatory Visit (HOSPITAL_COMMUNITY)
Admission: RE | Admit: 2013-07-10 | Discharge: 2013-07-10 | Disposition: A | Discharge status: Home / Self Care | Payer: Commercial Managed Care - PPO | Source: Ambulatory Visit | Attending: Endocrinology | Admitting: Endocrinology

## 2013-07-10 DIAGNOSIS — E0590 Thyrotoxicosis, unspecified without thyrotoxic crisis or storm: Secondary | ICD-10-CM | POA: Insufficient documentation

## 2013-07-10 MED ORDER — SODIUM IODIDE I 131 CAPSULE
17.9000 | Freq: Once | INTRAVENOUS | Status: AC | PRN
  Administered 2013-07-10: 17.9 via ORAL

## 2013-07-11 ENCOUNTER — Encounter (HOSPITAL_COMMUNITY)
Admission: RE | Admit: 2013-07-11 | Discharge: 2013-07-11 | Disposition: A | Discharge status: Home / Self Care | Payer: Commercial Managed Care - PPO | Source: Ambulatory Visit | Attending: Endocrinology | Admitting: Endocrinology

## 2013-07-11 MED ORDER — SODIUM PERTECHNETATE TC 99M INJECTION
10.0000 | Freq: Once | INTRAVENOUS | Status: AC | PRN
  Administered 2013-07-11: 10 via INTRAVENOUS

## 2013-07-12 ENCOUNTER — Other Ambulatory Visit: Payer: Self-pay | Admitting: Endocrinology

## 2013-07-12 ENCOUNTER — Encounter: Payer: Commercial Managed Care - PPO | Admitting: Family

## 2013-07-12 DIAGNOSIS — E0590 Thyrotoxicosis, unspecified without thyrotoxic crisis or storm: Secondary | ICD-10-CM

## 2013-07-19 ENCOUNTER — Encounter (HOSPITAL_COMMUNITY)
Admission: RE | Admit: 2013-07-19 | Discharge: 2013-07-19 | Disposition: A | Discharge status: Home / Self Care | Payer: Commercial Managed Care - PPO | Source: Ambulatory Visit | Attending: Endocrinology | Admitting: Endocrinology

## 2013-07-19 DIAGNOSIS — E0590 Thyrotoxicosis, unspecified without thyrotoxic crisis or storm: Secondary | ICD-10-CM | POA: Insufficient documentation

## 2013-07-19 LAB — HCG, SERUM, QUALITATIVE: Preg, Serum: NEGATIVE

## 2013-07-19 MED ORDER — SODIUM IODIDE I 131 CAPSULE
30.8000 | Freq: Once | INTRAVENOUS | Status: AC | PRN
  Administered 2013-07-19: 30.8 via ORAL

## 2013-08-16 ENCOUNTER — Other Ambulatory Visit (INDEPENDENT_AMBULATORY_CARE_PROVIDER_SITE_OTHER): Payer: Commercial Managed Care - PPO

## 2013-08-16 DIAGNOSIS — Z0000 Encounter for general adult medical examination without abnormal findings: Secondary | ICD-10-CM

## 2013-08-16 LAB — HEPATIC FUNCTION PANEL
ALBUMIN: 4.1 g/dL (ref 3.5–5.2)
ALT: 32 U/L (ref 0–35)
AST: 26 U/L (ref 0–37)
Alkaline Phosphatase: 70 U/L (ref 39–117)
Bilirubin, Direct: 0 mg/dL (ref 0.0–0.3)
Total Bilirubin: 0.5 mg/dL (ref 0.3–1.2)
Total Protein: 7.3 g/dL (ref 6.0–8.3)

## 2013-08-16 LAB — CBC WITH DIFFERENTIAL/PLATELET
BASOS ABS: 0 10*3/uL (ref 0.0–0.1)
Basophils Relative: 0.3 % (ref 0.0–3.0)
EOS ABS: 0.2 10*3/uL (ref 0.0–0.7)
Eosinophils Relative: 2.2 % (ref 0.0–5.0)
HEMATOCRIT: 41.4 % (ref 36.0–46.0)
HEMOGLOBIN: 14.3 g/dL (ref 12.0–15.0)
LYMPHS ABS: 1.5 10*3/uL (ref 0.7–4.0)
Lymphocytes Relative: 13.9 % (ref 12.0–46.0)
MCHC: 34.5 g/dL (ref 30.0–36.0)
MCV: 89.7 fl (ref 78.0–100.0)
Monocytes Absolute: 0.7 10*3/uL (ref 0.1–1.0)
Monocytes Relative: 6.6 % (ref 3.0–12.0)
Neutro Abs: 8.1 10*3/uL — ABNORMAL HIGH (ref 1.4–7.7)
Neutrophils Relative %: 77 % (ref 43.0–77.0)
Platelets: 239 10*3/uL (ref 150.0–400.0)
RBC: 4.61 Mil/uL (ref 3.87–5.11)
RDW: 12.7 % (ref 11.5–14.6)
WBC: 10.5 10*3/uL (ref 4.5–10.5)

## 2013-08-16 LAB — POCT URINALYSIS DIPSTICK
Bilirubin, UA: NEGATIVE
GLUCOSE UA: NEGATIVE
Ketones, UA: NEGATIVE
Leukocytes, UA: NEGATIVE
NITRITE UA: NEGATIVE
Protein, UA: NEGATIVE
Spec Grav, UA: <=1.005
UROBILINOGEN UA: 0.2
pH, UA: 6

## 2013-08-16 LAB — LIPID PANEL
CHOL/HDL RATIO: 5
CHOLESTEROL: 170 mg/dL (ref 0–200)
HDL: 33 mg/dL — ABNORMAL LOW (ref >39.00)
LDL Cholesterol: 114 mg/dL — ABNORMAL HIGH (ref 0–99)
Triglycerides: 115 mg/dL (ref 0.0–149.0)
VLDL: 23 mg/dL (ref 0.0–40.0)

## 2013-08-16 LAB — BASIC METABOLIC PANEL
BUN: 9 mg/dL (ref 6–23)
CO2: 24 mEq/L (ref 19–32)
Calcium: 9.7 mg/dL (ref 8.4–10.5)
Chloride: 105 mEq/L (ref 96–112)
Creatinine, Ser: 0.6 mg/dL (ref 0.4–1.2)
GFR: 123.77 mL/min (ref >60.00)
GLUCOSE: 91 mg/dL (ref 70–99)
Potassium: 5 mEq/L (ref 3.5–5.1)
SODIUM: 137 meq/L (ref 135–145)

## 2013-08-16 LAB — TSH: TSH: 0.07 u[IU]/mL — AB (ref 0.35–5.50)

## 2013-08-23 ENCOUNTER — Encounter: Payer: Self-pay | Admitting: Family

## 2013-08-23 ENCOUNTER — Ambulatory Visit (INDEPENDENT_AMBULATORY_CARE_PROVIDER_SITE_OTHER): Payer: Commercial Managed Care - PPO | Admitting: Family

## 2013-08-23 ENCOUNTER — Other Ambulatory Visit (HOSPITAL_COMMUNITY)
Admission: RE | Admit: 2013-08-23 | Discharge: 2013-08-23 | Disposition: A | Discharge status: Home / Self Care | Payer: Commercial Managed Care - PPO | Source: Ambulatory Visit | Attending: Family | Admitting: Family

## 2013-08-23 VITALS — BP 116/62 | HR 78 | Temp 98.9°F | Ht 62.0 in | Wt 162.0 lb

## 2013-08-23 DIAGNOSIS — Z1239 Encounter for other screening for malignant neoplasm of breast: Secondary | ICD-10-CM

## 2013-08-23 DIAGNOSIS — Z113 Encounter for screening for infections with a predominantly sexual mode of transmission: Secondary | ICD-10-CM | POA: Insufficient documentation

## 2013-08-23 DIAGNOSIS — Z0000 Encounter for general adult medical examination without abnormal findings: Secondary | ICD-10-CM

## 2013-08-23 DIAGNOSIS — Z01419 Encounter for gynecological examination (general) (routine) without abnormal findings: Secondary | ICD-10-CM | POA: Insufficient documentation

## 2013-08-23 DIAGNOSIS — N760 Acute vaginitis: Secondary | ICD-10-CM | POA: Insufficient documentation

## 2013-08-23 DIAGNOSIS — Z1231 Encounter for screening mammogram for malignant neoplasm of breast: Secondary | ICD-10-CM

## 2013-08-23 DIAGNOSIS — Z124 Encounter for screening for malignant neoplasm of cervix: Secondary | ICD-10-CM

## 2013-08-23 DIAGNOSIS — Z23 Encounter for immunization: Secondary | ICD-10-CM

## 2013-08-23 NOTE — Progress Notes (Signed)
Pre visit review using our clinic review tool, if applicable. No additional management support is needed unless otherwise documented below in the visit note. 

## 2013-08-23 NOTE — Progress Notes (Signed)
   Subjective:    Patient ID: [name], female    DOB: 09-13-1975, 38 y.o.   MRN: [id number]  HPI  38 year old caucasian female, nonsmoker presenting annual physical examination.  This is a routine wellness  examination for this patient . I reviewed all health maintenance protocols including mammogram.  Needed referrals were placed. Her immunization history and labs were reviewed and appropriate vaccinations were ordered.  The plan for yearly health maintenance was discussed all orders and referrals were made as appropriate.    Review of Systems  Constitutional: Negative.   HENT: Negative.   Eyes: Negative.   Respiratory: Negative.   Cardiovascular: Negative.   Gastrointestinal: Negative.   Endocrine: Negative.   Genitourinary: Negative.   Musculoskeletal: Negative.   Skin: Negative.   Allergic/Immunologic: Negative.   Neurological: Negative.   Hematological: Negative.   Psychiatric/Behavioral: Negative.    Past Medical History  Diagnosis Date  . Thyroid nodule     History   Social History  . Marital Status: Married    Spouse Name: N/A    Number of Children: N/A  . Years of Education: N/A   Occupational History  . Not on file.   Social History Main Topics  . Smoking status: Former Smoker -- 2.00 packs/day    Types: Cigarettes  . Smokeless tobacco: Former Systems developer    Quit date: 10/14/2012  . Alcohol Use: 3.0 oz/week    6 drink(s) per week  . Drug Use: No  . Sexual Activity: Yes    Birth Control/ Protection: None   Other Topics Concern  . Not on file   Social History Narrative  . No narrative on file    Past Surgical History  Procedure Laterality Date  . Cesarean section    . Abdominal hysterectomy  2008    Family History  Problem Relation Age of Onset  . Diabetes Father   . Heart disease Father   . Hypertension Father   . Cancer Maternal Grandmother     No Known Allergies  No current outpatient prescriptions on file prior to visit.   No  current facility-administered medications on file prior to visit.    BP 116/62  Pulse 78  Temp(Src) 98.9 F (37.2 C) (Oral)  Ht 5\' 2"  (1.575 m)  Wt 162 lb (73.483 kg)  BMI 29.62 kg/m2  SpO2 99%chart    Objective:   Physical Exam  Constitutional: She is oriented to person, place, and time. She appears well-developed and well-nourished.  HENT:  Head: Normocephalic.  Right Ear: External ear normal.  Left Ear: External ear normal.  Eyes: Conjunctivae and EOM are normal. Pupils are equal, round, and reactive to light.  Neck: Normal range of motion. Neck supple.  Cardiovascular: Normal rate, regular rhythm and normal heart sounds.   Pulmonary/Chest: Effort normal and breath sounds normal.  Abdominal: Soft. Bowel sounds are normal.  Genitourinary: Vagina normal and uterus normal.  Musculoskeletal: Normal range of motion.  Neurological: She is alert and oriented to person, place, and time. She has normal reflexes.  Skin: Skin is warm and dry.  Psychiatric: She has a normal mood and affect. Her behavior is normal. Judgment and thought content normal.         Assessment & Plan:  Assessment 1. Annual Physical Examination  Plan 1. Encourage self breast examination 2.Referral for mammogram due to family history.  3.Contact office for questions or concerns.

## 2013-08-23 NOTE — Patient Instructions (Signed)

## 2013-08-26 LAB — CERVICOVAGINAL ANCILLARY ONLY
Bacterial vaginitis: POSITIVE — AB
Bacterial vaginitis: POSITIVE — AB
CANDIDA VAGINITIS: NEGATIVE

## 2013-08-27 ENCOUNTER — Telehealth: Payer: Self-pay | Admitting: Family

## 2013-08-27 ENCOUNTER — Other Ambulatory Visit: Payer: Self-pay | Admitting: Family

## 2013-08-27 MED ORDER — METRONIDAZOLE 500 MG PO TABS: 500.0000 mg | ORAL_TABLET | Freq: Three times a day (TID) | ORAL | Status: DC

## 2013-08-27 NOTE — Telephone Encounter (Signed)
See result note.  

## 2013-08-27 NOTE — Telephone Encounter (Signed)
Pt stated her pharm call her about pick up rx generic flagyl. Pt did not know there was a problem

## 2013-09-12 ENCOUNTER — Telehealth: Payer: Self-pay | Admitting: Family

## 2013-09-12 ENCOUNTER — Ambulatory Visit
Admission: RE | Admit: 2013-09-12 | Discharge: 2013-09-12 | Disposition: A | Discharge status: Home / Self Care | Payer: Commercial Managed Care - PPO | Source: Ambulatory Visit | Attending: Family | Admitting: Family

## 2013-09-12 ENCOUNTER — Other Ambulatory Visit: Payer: Self-pay | Admitting: Family

## 2013-09-12 DIAGNOSIS — Z1231 Encounter for screening mammogram for malignant neoplasm of breast: Secondary | ICD-10-CM

## 2013-09-12 DIAGNOSIS — Z1239 Encounter for other screening for malignant neoplasm of breast: Secondary | ICD-10-CM

## 2013-09-12 NOTE — Telephone Encounter (Signed)
Pt needs an order to breast center for diagnostic mammogram. Cyst in breast. Please put order in system

## 2013-09-13 ENCOUNTER — Other Ambulatory Visit: Payer: Self-pay | Admitting: Family

## 2013-09-13 DIAGNOSIS — N63 Unspecified lump in unspecified breast: Secondary | ICD-10-CM

## 2013-09-17 ENCOUNTER — Other Ambulatory Visit: Payer: Self-pay | Admitting: Family

## 2013-09-17 DIAGNOSIS — N63 Unspecified lump in unspecified breast: Secondary | ICD-10-CM

## 2013-09-17 NOTE — Telephone Encounter (Signed)
Pt called concerning incorrect dx for mammogram. Advised pt that correct code was placed today therefore she should be able to schedule appointment at [facility].

## 2013-09-26 ENCOUNTER — Ambulatory Visit
Admission: RE | Admit: 2013-09-26 | Discharge: 2013-09-26 | Disposition: A | Discharge status: Home / Self Care | Payer: Self-pay | Source: Ambulatory Visit | Attending: Family | Admitting: Family

## 2013-09-26 ENCOUNTER — Other Ambulatory Visit: Payer: Commercial Managed Care - PPO

## 2013-09-26 ENCOUNTER — Ambulatory Visit: Payer: Commercial Managed Care - PPO

## 2013-09-26 ENCOUNTER — Ambulatory Visit
Admission: RE | Admit: 2013-09-26 | Discharge: 2013-09-26 | Disposition: A | Discharge status: Home / Self Care | Payer: Commercial Managed Care - PPO | Source: Ambulatory Visit

## 2013-09-26 ENCOUNTER — Ambulatory Visit
Admission: RE | Admit: 2013-09-26 | Discharge: 2013-09-26 | Disposition: A | Discharge status: Home / Self Care | Payer: Commercial Managed Care - PPO | Source: Ambulatory Visit | Attending: Family | Admitting: Family

## 2013-09-26 ENCOUNTER — Telehealth: Payer: Self-pay | Admitting: Family

## 2013-09-26 ENCOUNTER — Encounter (INDEPENDENT_AMBULATORY_CARE_PROVIDER_SITE_OTHER): Payer: Self-pay

## 2013-09-26 DIAGNOSIS — N63 Unspecified lump in unspecified breast: Secondary | ICD-10-CM

## 2013-09-26 NOTE — Procedures (Signed)
I called Pt.'s Dr.'s office to find out where the Dr. Had felt the Bilateral breast lumps.  I was told that there was no record of the Dr. finding lumps in either of the Pt.'s breasts.  It was also noted that the Pt. had a family history of breast cancer but the Pt. states that is incorrect, she has NO family history of breast cancer.   LCE

## 2013-09-26 NOTE — Telephone Encounter (Signed)
Standard Diagnostic Mammogram will be done on patient because they don't have any details on where the cysts are located on patient.  They called for the details of the location but I was unable to find any notes.

## 2013-09-27 ENCOUNTER — Other Ambulatory Visit: Payer: Self-pay | Admitting: Family

## 2013-09-27 DIAGNOSIS — N63 Unspecified lump in unspecified breast: Secondary | ICD-10-CM

## 2013-09-27 NOTE — Telephone Encounter (Signed)
Unable to find.  Please advise.  Thanks!

## 2013-10-01 NOTE — Telephone Encounter (Signed)
Standard mammogram ok

## 2013-11-14 ENCOUNTER — Telehealth: Payer: Self-pay

## 2013-11-14 NOTE — Telephone Encounter (Signed)
Pt called concerning a weight loss suplement. Pt states she took a supplement called Super HD extreme purchased at [facility]. After taking the supplement the pt has been experiencing swelling and burning in her neck. Pt denies shortness of breath. Dr. Loanne Drilling is out of the office. Asked covering provider Dr. Cruzita Lederer. Received verbal for pt to D/c supplement, take 400 mg of ibuprofen monitor for 4 hrs to see if symptoms persist. See urgent care if symptoms persist. Pt advised.

## 2014-01-28 ENCOUNTER — Encounter: Payer: Self-pay | Admitting: Endocrinology

## 2014-01-28 ENCOUNTER — Ambulatory Visit (INDEPENDENT_AMBULATORY_CARE_PROVIDER_SITE_OTHER): Payer: Commercial Managed Care - PPO | Admitting: Endocrinology

## 2014-01-28 VITALS — BP 122/80 | HR 73 | Temp 98.7°F | Ht 62.0 in | Wt 168.0 lb

## 2014-01-28 DIAGNOSIS — E0590 Thyrotoxicosis, unspecified without thyrotoxic crisis or storm: Secondary | ICD-10-CM

## 2014-01-28 NOTE — Patient Instructions (Addendum)
blood tests are being requested for you today.  We'll contact you with results. When the thyroid blood tests are back to normal, we'll recheck the ultrasound.

## 2014-01-28 NOTE — Progress Notes (Signed)
   Subjective:    Patient ID: [name], female    DOB: 11-15-1975, 38 y.o.   MRN: [id number]  HPI Pt returns for f/u of hyperthyroidism, due to multinodular goiter (dx'ed 2014; US showed 4.1 cm predominately solid nodule seen in the inferior portion of right thyroid lobe; nuc med scan showed the large nodule was hyperfunctioning).  She had I-131 rx in early 2015.  Since then, pt states she feels well in general, except for fatigue. Past Medical History  Diagnosis Date  . Thyroid nodule     Past Surgical History  Procedure Laterality Date  . Cesarean section    . Abdominal hysterectomy  2008    History   Social History  . Marital Status: Married    Spouse Name: N/A    Number of Children: N/A  . Years of Education: N/A   Occupational History  . Not on file.   Social History Main Topics  . Smoking status: Former Smoker -- 2.00 packs/day    Types: Cigarettes  . Smokeless tobacco: Former Systems developer    Quit date: 10/14/2012  . Alcohol Use: 3.0 oz/week    6 drink(s) per week  . Drug Use: No  . Sexual Activity: Yes    Birth Control/ Protection: None   Other Topics Concern  . Not on file   Social History Narrative  . No narrative on file    Current Outpatient Prescriptions on File Prior to Visit  Medication Sig Dispense Refill  . metroNIDAZOLE (FLAGYL) 500 MG tablet Take 1 tablet (500 mg total) by mouth 3 (three) times daily.  14 tablet  0   No current facility-administered medications on file prior to visit.    No Known Allergies  Family History  Problem Relation Age of Onset  . Diabetes Father   . Heart disease Father   . Hypertension Father   . Cancer Maternal Grandmother     BP 122/80  Pulse 73  Temp(Src) 98.7 F (37.1 C) (Oral)  Ht 5\' 2"  (1.575 m)  Wt 168 lb (76.204 kg)  BMI 30.72 kg/m2  SpO2 97%   Review of Systems Denies weight change    Objective:   Physical Exam VITAL SIGNS:  See vs page GENERAL: no distress NECK: right thyroid mass is  smaller--now 2-3 cm  Lab Results  Component Value Date   TSH 15.13* 01/28/2014   T3TOTAL 191.0 01/14/2013       Assessment & Plan:  Post-I-131 hypothyroidism, new Thyroid nodule: clinically smaller.  Patient is advised the following: Patient Instructions  blood tests are being requested for you today.  We'll contact you with results. When the thyroid blood tests are back to normal, we'll recheck the ultrasound.

## 2014-01-29 LAB — TSH: TSH: 15.13 u[IU]/mL — ABNORMAL HIGH (ref 0.35–4.50)

## 2014-01-29 LAB — T4, FREE: Free T4: 0.28 ng/dL — ABNORMAL LOW (ref 0.60–1.60)

## 2014-01-31 ENCOUNTER — Telehealth: Payer: Self-pay | Admitting: Endocrinology

## 2014-01-31 MED ORDER — LEVOTHYROXINE SODIUM 75 MCG PO TABS: 75.0000 ug | ORAL_TABLET | Freq: Every day | ORAL | Status: DC

## 2014-01-31 NOTE — Telephone Encounter (Signed)
Ok, i have sent a prescription to your pharmacy  

## 2014-01-31 NOTE — Telephone Encounter (Signed)
See below, I could not locate what medication needed to be sent. Please advise, Thanks!

## 2014-01-31 NOTE — Telephone Encounter (Signed)
Please call the thyroid med into [facility] off of [address] [place] the one at [facility] please

## 2014-03-03 ENCOUNTER — Telehealth: Payer: Self-pay | Admitting: Endocrinology

## 2014-03-03 NOTE — Telephone Encounter (Signed)
F/u ov is due.  Please refill the medication x 1, pending ov here

## 2014-03-03 NOTE — Telephone Encounter (Signed)
Finished taking all meds Levethyroxine 75 mg, please advise on what to do.

## 2014-03-03 NOTE — Telephone Encounter (Signed)
Called pt advised of Md's note. Pt scheduled for 11/10 for follow up appointment. Pt had one refill left of her medication pt states she will claim the refill at her pharmacy.

## 2014-03-03 NOTE — Telephone Encounter (Signed)
See below, is pt to continue taking medication? Thanks!

## 2014-03-18 ENCOUNTER — Ambulatory Visit (INDEPENDENT_AMBULATORY_CARE_PROVIDER_SITE_OTHER): Payer: Commercial Managed Care - PPO | Admitting: Endocrinology

## 2014-03-18 ENCOUNTER — Encounter: Payer: Self-pay | Admitting: Endocrinology

## 2014-03-18 VITALS — BP 124/82 | HR 85 | Temp 98.8°F | Ht 62.0 in | Wt 174.0 lb

## 2014-03-18 DIAGNOSIS — E0590 Thyrotoxicosis, unspecified without thyrotoxic crisis or storm: Secondary | ICD-10-CM

## 2014-03-18 LAB — TSH: TSH: 18.02 u[IU]/mL — ABNORMAL HIGH (ref 0.35–4.50)

## 2014-03-18 MED ORDER — LEVOTHYROXINE SODIUM 125 MCG PO TABS: 125.0000 ug | ORAL_TABLET | Freq: Every day | ORAL | Status: DC

## 2014-03-18 NOTE — Progress Notes (Signed)
   Subjective:    Patient ID: [name], female    DOB: 1975/11/16, 38 y.o.   MRN: [id number]  HPI Pt returns for f/u of hyperthyroidism, due to multinodular goiter (dx'ed 2014; US showed 4.1 cm predominately solid nodule seen in the inferior portion of right thyroid lobe; nuc med scan showed the large nodule was hyperfunctioning; she had I-131 rx in early 2015; in Sept of 2015, she was rx'ed synthroid due to high TSH).  Since then, pt states she feels well in general, except for weight gain. Past Medical History  Diagnosis Date  . Thyroid nodule     Past Surgical History  Procedure Laterality Date  . Cesarean section    . Abdominal hysterectomy  2008    History   Social History  . Marital Status: Married    Spouse Name: N/A    Number of Children: N/A  . Years of Education: N/A   Occupational History  . Not on file.   Social History Main Topics  . Smoking status: Former Smoker -- 2.00 packs/day    Types: Cigarettes  . Smokeless tobacco: Former Systems developer    Quit date: 10/14/2012  . Alcohol Use: 3.0 oz/week    6 drink(s) per week  . Drug Use: No  . Sexual Activity: Yes    Birth Control/ Protection: None   Other Topics Concern  . Not on file   Social History Narrative    No current outpatient prescriptions on file prior to visit.   No current facility-administered medications on file prior to visit.    No Known Allergies  Family History  Problem Relation Age of Onset  . Diabetes Father   . Heart disease Father   . Hypertension Father   . Cancer Maternal Grandmother     BP 124/82 mmHg  Pulse 85  Temp(Src) 98.8 F (37.1 C) (Oral)  Ht 5\' 2"  (1.575 m)  Wt 174 lb (78.926 kg)  BMI 31.82 kg/m2  SpO2 98%  Review of Systems Denies tremor    Objective:   Physical Exam VITAL SIGNS:  See vs page GENERAL: no distress.  NECK: right lower pole thyroid mass is again noted (2-3 cm).     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  Multinodular goiter, clinically stable.    Patient is advised the following: Patient Instructions  blood tests are being requested for you today.  We'll contact you with results. Let's plan to recheck the ultrasound next year. Please come back for a follow-up appointment in 3 months.

## 2014-03-18 NOTE — Patient Instructions (Signed)
blood tests are being requested for you today.  We'll contact you with results. Let's plan to recheck the ultrasound next year. Please come back for a follow-up appointment in 3 months.

## 2014-05-06 ENCOUNTER — Telehealth: Payer: Self-pay | Admitting: Endocrinology

## 2014-05-06 NOTE — Telephone Encounter (Signed)
Please see below and advise in Dr. Ellisons absence. 

## 2014-05-06 NOTE — Telephone Encounter (Signed)
lmtcb

## 2014-05-06 NOTE — Telephone Encounter (Signed)
Noted, patient is aware. 

## 2014-05-06 NOTE — Telephone Encounter (Signed)
Zyrtec will stop hives from any cause.  It may be best to start taking the Zyrtec and follow up with Dr. Loanne Drilling

## 2014-05-06 NOTE — Telephone Encounter (Signed)
Patient stated that she was taking zyrtec and her Levothyroxine together, and as soon as she stop taking her Zyrtec she broke out in hives, she has welts all over. She want to know if the Levothyroxine is causing it. Please advise

## 2014-07-01 ENCOUNTER — Ambulatory Visit (INDEPENDENT_AMBULATORY_CARE_PROVIDER_SITE_OTHER): Payer: Commercial Managed Care - PPO | Admitting: Family Medicine

## 2014-07-01 ENCOUNTER — Encounter: Payer: Self-pay | Admitting: Family Medicine

## 2014-07-01 VITALS — BP 122/80 | HR 93 | Temp 98.2°F | Wt 172.0 lb

## 2014-07-01 DIAGNOSIS — L508 Other urticaria: Secondary | ICD-10-CM

## 2014-07-01 NOTE — Progress Notes (Signed)
Pre visit review using our clinic review tool, if applicable. No additional management support is needed unless otherwise documented below in the visit note. 

## 2014-07-01 NOTE — Progress Notes (Signed)
   Subjective:    Patient ID: [name], female    DOB: [birth year], 39 y.o.   MRN: [id number]  HPI  Patient seen with recurrent hives off and on for past few months. She is followed by endocrinology. She's had hyperthyroidism and radioactive treatment with subsequent hypothyroidism and levothyroxine is being regulated. She is currently on 125 g daily. She describes recurrent hives that are widespread involving her neck and upper and lower extremities and trunk. She is taking Claritin which helped somewhat. No history of food allergies. No other regular medications. She consulted with pharmacist and endocrinologist and they did not feel her levothyroxine was likely culprit. No change of soaps or detergents. No history of angioedema symptoms. No clear precipitants. No obvious wheezing  Past Medical History  Diagnosis Date  . Thyroid nodule    Past Surgical History  Procedure Laterality Date  . Cesarean section    . Abdominal hysterectomy  2008    reports that she has quit smoking. Her smoking use included Cigarettes. She smoked 2.00 packs per day. She quit smokeless tobacco use about 20 months ago. She reports that she drinks about 3.0 oz of alcohol per week. She reports that she does not use illicit drugs. family history includes Cancer in her maternal grandmother; Diabetes in her father; Heart disease in her father; Hypertension in her father. No Known Allergies   Review of Systems  Constitutional: Negative for fever and chills.  HENT: Negative for congestion, trouble swallowing and voice change.   Respiratory: Negative for cough and wheezing.   Skin: Positive for rash.  Neurological: Negative for dizziness.       Objective:   Physical Exam  Constitutional: She appears well-developed and well-nourished.  HENT:  Mouth/Throat: Oropharynx is clear and moist.  Neck: Neck supple. No thyromegaly present.  Cardiovascular: Normal rate and regular rhythm.   Pulmonary/Chest: Effort  normal and breath sounds normal. No respiratory distress. She has no wheezes. She has no rales.  Lymphadenopathy:    She has no cervical adenopathy.  Skin: No rash noted.          Assessment & Plan:  Patient presents with reported intermittent urticaria over the past few months. None visible on exam today. We have suggested adding over-the-counter Zantac or Pepcid daily to her Claritin. Given duration of symptoms, set up allergy referral. Patient agrees with this plan. She is not describing any angioedema type symptoms.

## 2014-07-01 NOTE — Patient Instructions (Signed)
Hives Hives are itchy, red, swollen areas of the skin. They can vary in size and location on your body. Hives can come and go for hours or several days (acute hives) or for several weeks (chronic hives). Hives do not spread from person to person (noncontagious). They may get worse with scratching, exercise, and emotional stress. CAUSES   Allergic reaction to food, additives, or drugs.  Infections, including the common cold.  Illness, such as vasculitis, lupus, or thyroid disease.  Exposure to sunlight, heat, or cold.  Exercise.  Stress.  Contact with chemicals. SYMPTOMS   Red or white swollen patches on the skin. The patches may change size, shape, and location quickly and repeatedly.  Itching.  Swelling of the hands, feet, and face. This may occur if hives develop deeper in the skin. DIAGNOSIS  Your caregiver can usually tell what is wrong by performing a physical exam. Skin or blood tests may also be done to determine the cause of your hives. In some cases, the cause cannot be determined. TREATMENT  Mild cases usually get better with medicines such as antihistamines. Severe cases may require an emergency epinephrine injection. If the cause of your hives is known, treatment includes avoiding that trigger.  HOME CARE INSTRUCTIONS   Avoid causes that trigger your hives.  Take antihistamines as directed by your caregiver to reduce the severity of your hives. Non-sedating or low-sedating antihistamines are usually recommended. Do not drive while taking an antihistamine.  Take any other medicines prescribed for itching as directed by your caregiver.  Wear loose-fitting clothing.  Keep all follow-up appointments as directed by your caregiver. SEEK MEDICAL CARE IF:   You have persistent or severe itching that is not relieved with medicine.  You have painful or swollen joints. SEEK IMMEDIATE MEDICAL CARE IF:   You have a fever.  Your tongue or lips are swollen.  You have  trouble breathing or swallowing.  You feel tightness in the throat or chest.  You have abdominal pain. These problems may be the first sign of a life-threatening allergic reaction. Call your local emergency services (911 in [place] MAKE SURE YOU:   Understand these instructions.  Will watch your condition.  Will get help right away if you are not doing well or get worse. Document Released: 04/25/2005 Document Revised: 04/30/2013 Document Reviewed: 07/19/2011 [facility] Patient Information 2015 [place], [place]. This information is not intended to replace advice given to you by your health care provider. Make sure you discuss any questions you have with your health care provider.  Consider addition of OTC Zantac or Pepcid daily to your claritan for hive prevention.

## 2014-07-02 ENCOUNTER — Ambulatory Visit (INDEPENDENT_AMBULATORY_CARE_PROVIDER_SITE_OTHER): Payer: Commercial Managed Care - PPO | Admitting: Endocrinology

## 2014-07-02 ENCOUNTER — Encounter: Payer: Self-pay | Admitting: Endocrinology

## 2014-07-02 VITALS — BP 132/84 | HR 93 | Temp 97.8°F | Ht 62.0 in | Wt 170.0 lb

## 2014-07-02 DIAGNOSIS — E079 Disorder of thyroid, unspecified: Secondary | ICD-10-CM

## 2014-07-02 DIAGNOSIS — E0590 Thyrotoxicosis, unspecified without thyrotoxic crisis or storm: Secondary | ICD-10-CM

## 2014-07-02 LAB — TSH: TSH: 0.75 u[IU]/mL (ref 0.35–4.50)

## 2014-07-02 NOTE — Patient Instructions (Signed)
blood tests are being requested for you today.  We'll let you know about the results. Let's recheck the ultrasound.  you will receive a phone call, about a day and time for an appointment. Please come back for a follow-up appointment in 6 months.

## 2014-07-02 NOTE — Progress Notes (Signed)
   Subjective:    Patient ID: [name], female    DOB: 04/11/1976, 39 y.o.   MRN: [id number]  HPI Pt returns for f/u of hyperthyroidism, due to multinodular goiter (dx'ed 2014; US showed 4.1 cm predominately solid nodule seen in the inferior portion of right thyroid lobe; nuc med scan showed the large nodule was hyperfunctioning; she had I-131 rx in early 2015; in Sept of 2015, she was rx'ed synthroid due to high TSH).  Pt states ongoing moderate fullness at the anterior neck, and assoc pain.  Her weight fluctuates.  She wants to see a Psychologist, sport and exercise for this. Past Medical History  Diagnosis Date  . Thyroid nodule     Past Surgical History  Procedure Laterality Date  . Cesarean section    . Abdominal hysterectomy  2008    History   Social History  . Marital Status: Married    Spouse Name: N/A  . Number of Children: N/A  . Years of Education: N/A   Occupational History  . Not on file.   Social History Main Topics  . Smoking status: Former Smoker -- 2.00 packs/day    Types: Cigarettes  . Smokeless tobacco: Former Systems developer    Quit date: 10/14/2012  . Alcohol Use: 3.0 oz/week    6 drink(s) per week  . Drug Use: No  . Sexual Activity: Yes    Birth Control/ Protection: None   Other Topics Concern  . Not on file   Social History Narrative    Current Outpatient Prescriptions on File Prior to Visit  Medication Sig Dispense Refill  . levothyroxine (SYNTHROID, LEVOTHROID) 125 MCG tablet Take 1 tablet (125 mcg total) by mouth daily before breakfast. 30 tablet 5   No current facility-administered medications on file prior to visit.    No Known Allergies  Family History  Problem Relation Age of Onset  . Diabetes Father   . Heart disease Father   . Hypertension Father   . Cancer Maternal Grandmother     BP 132/84 mmHg  Pulse 93  Temp(Src) 97.8 F (36.6 C) (Oral)  Ht 5\' 2"  (1.575 m)  Wt 170 lb (77.111 kg)  BMI 31.09 kg/m2  SpO2 97%  Review of Systems She has slight  lightheadedness and hair loss.      Objective:   Physical Exam VITAL SIGNS:  See vs page GENERAL: no distress NECK: right lower pole thyroid mass is again noted (2-3 cm).    Lab Results  Component Value Date   TSH 0.75 07/02/2014   T3TOTAL 191.0 01/14/2013       Assessment & Plan:  Multinodular goiter, clinically persistent after I-131 rx. 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 Hair loss: new to me: not thyroid-related.  i advised non-prescription rx  Patient is advised the following: Patient Instructions  blood tests are being requested for you today.  We'll let you know about the results. Let's recheck the ultrasound.  you will receive a phone call, about a day and time for an appointment. Please come back for a follow-up appointment in 6 months.    addendum: ref surg Please continue the same synthroid

## 2014-07-07 ENCOUNTER — Ambulatory Visit
Admission: RE | Admit: 2014-07-07 | Discharge: 2014-07-07 | Disposition: A | Discharge status: Home / Self Care | Payer: Commercial Managed Care - PPO | Source: Ambulatory Visit | Attending: Endocrinology | Admitting: Endocrinology

## 2014-07-07 DIAGNOSIS — E079 Disorder of thyroid, unspecified: Secondary | ICD-10-CM

## 2014-07-14 ENCOUNTER — Ambulatory Visit (INDEPENDENT_AMBULATORY_CARE_PROVIDER_SITE_OTHER): Payer: Self-pay | Admitting: General Surgery

## 2014-07-14 NOTE — H&P (Signed)
History of Present Illness [name] MD; 07/14/2014 9:01 AM) Patient words: thyroid mass.  The patient is a 39 year old female who presents with a thyroid nodule. The patient is a 39 year old female who is referred by Dr. Loanne Drilling for an evaluation of a right thyroid nodule. Patient was diagnosed with a goiter approximately year ago. Patient also had a 4 cm nodule in the right lobe of thyroid which was FNA. FNA revealed suspicion for follicular neoplasm. Patient subsequently underwent radioactive iodine treatment for her goiter. Present most recent ultrasound this did show a decrease in the goiter however the patient has had some symptoms of pressure in her neck. Most recent ultrasound ulcer reveals the right side nodule has shrunk to approximately 2.3 cm. Patient does have some small thyroid tissue on the left side. Patient has been undergoing Synthroid treatment.    Allergies [name], [place]; 07/14/2014 8:52 AM) Delma Freeze *ANTIHISTAMINES* Rash.  Medication History [name], [place]; 07/14/2014 8:52 AM) Levothyroxine Sodium (125MCG Tablet, Oral) Active.  Vitals Alyse Low [place] MA; 07/14/2014 8:48 AM) 07/14/2014 8:48 AM Weight: 169.6 lb Height: 63in Body Surface Area: 1.85 m Body Mass Index: 30.04 kg/m Temp.: 97.76F(Temporal)  Resp.: 16 (Unlabored)  BP: 108/70 (Sitting, Left Arm, Standard)    Physical Exam [name] MD; 07/14/2014 9:01 AM) General Mental Status-Alert. General Appearance-Consistent with stated age. Hydration-Well hydrated. Voice-Normal.  Head and Neck Thyroid  Gland Characteristics: There is a palpable mass/nodule found and described as follows: - The location of the mass/nodule is the - right lobe, lower pole.  Chest and Lung Exam Chest and lung exam reveals -quiet, even and easy respiratory effort with no use of accessory muscles and on auscultation, normal breath sounds, no adventitious sounds and normal vocal  resonance. Inspection Chest Wall - Normal. Back - normal.  Cardiovascular Cardiovascular examination reveals -normal heart sounds, regular rate and rhythm with no murmurs and normal pedal pulses bilaterally.  Abdomen Inspection Inspection of the abdomen reveals - No Hernias. Skin - Scar - no surgical scars. Palpation/Percussion Palpation and Percussion of the abdomen reveal - Soft, Non Tender, No Rebound tenderness, No Rigidity (guarding) and No hepatosplenomegaly. Auscultation Auscultation of the abdomen reveals - Bowel sounds normal.  Neurologic Neurologic evaluation reveals -alert and oriented x 3 with no impairment of recent or remote memory. Mental Status-Normal.    Assessment & Plan [name] MD; 07/14/2014 9:03 AM) THYROID NODULE (241.0  E04.1) Impression: 39 year old female with a right thyroid nodule  1. Will proceed to the operative for total thyroidectomy. This is due secondary to the fact the patient had radioactive iodine and is on Synthroid treatment at this time. 2.All risks and benefits were discussed with the patient to generally include: infection, bleeding, damage to the recurrent laryngeal nerve, damage to parathyroid glands, and possible need for further surgery. Alternatives were offered and described. All questions were answered and the patient voiced understanding of the procedure and wishes to proceed.

## 2014-07-15 ENCOUNTER — Telehealth: Payer: Self-pay | Admitting: Endocrinology

## 2014-07-15 NOTE — Telephone Encounter (Signed)
Received records from [facility]. Sent to Dr. Loanne Drilling on 07/15/14/ss.

## 2014-08-04 IMAGING — CR DG NECK SOFT TISSUE
2 series · 2 of 2 positions shown · non-contrast
Comparison: None

CLINICAL DATA: Nodule of the neck.  Dysphagia.

NECK SOFT TISSUES - 1+ VIEW

[AP]
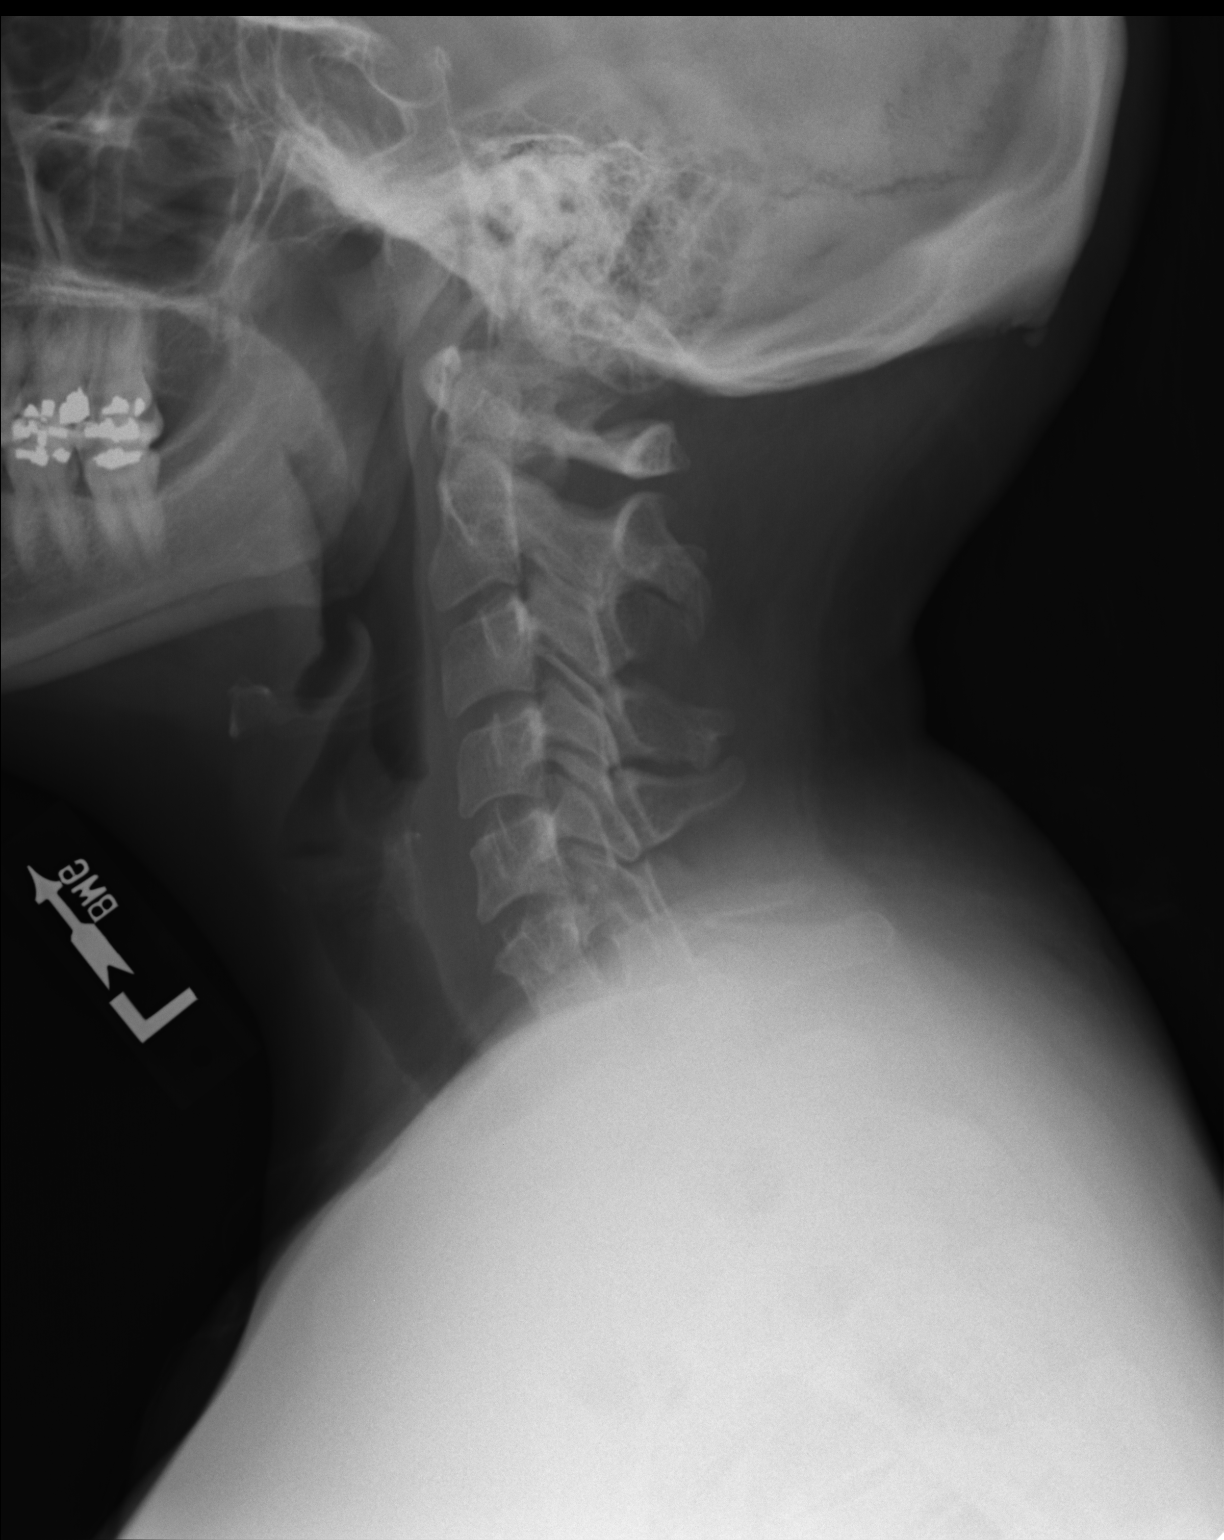

[lateral]
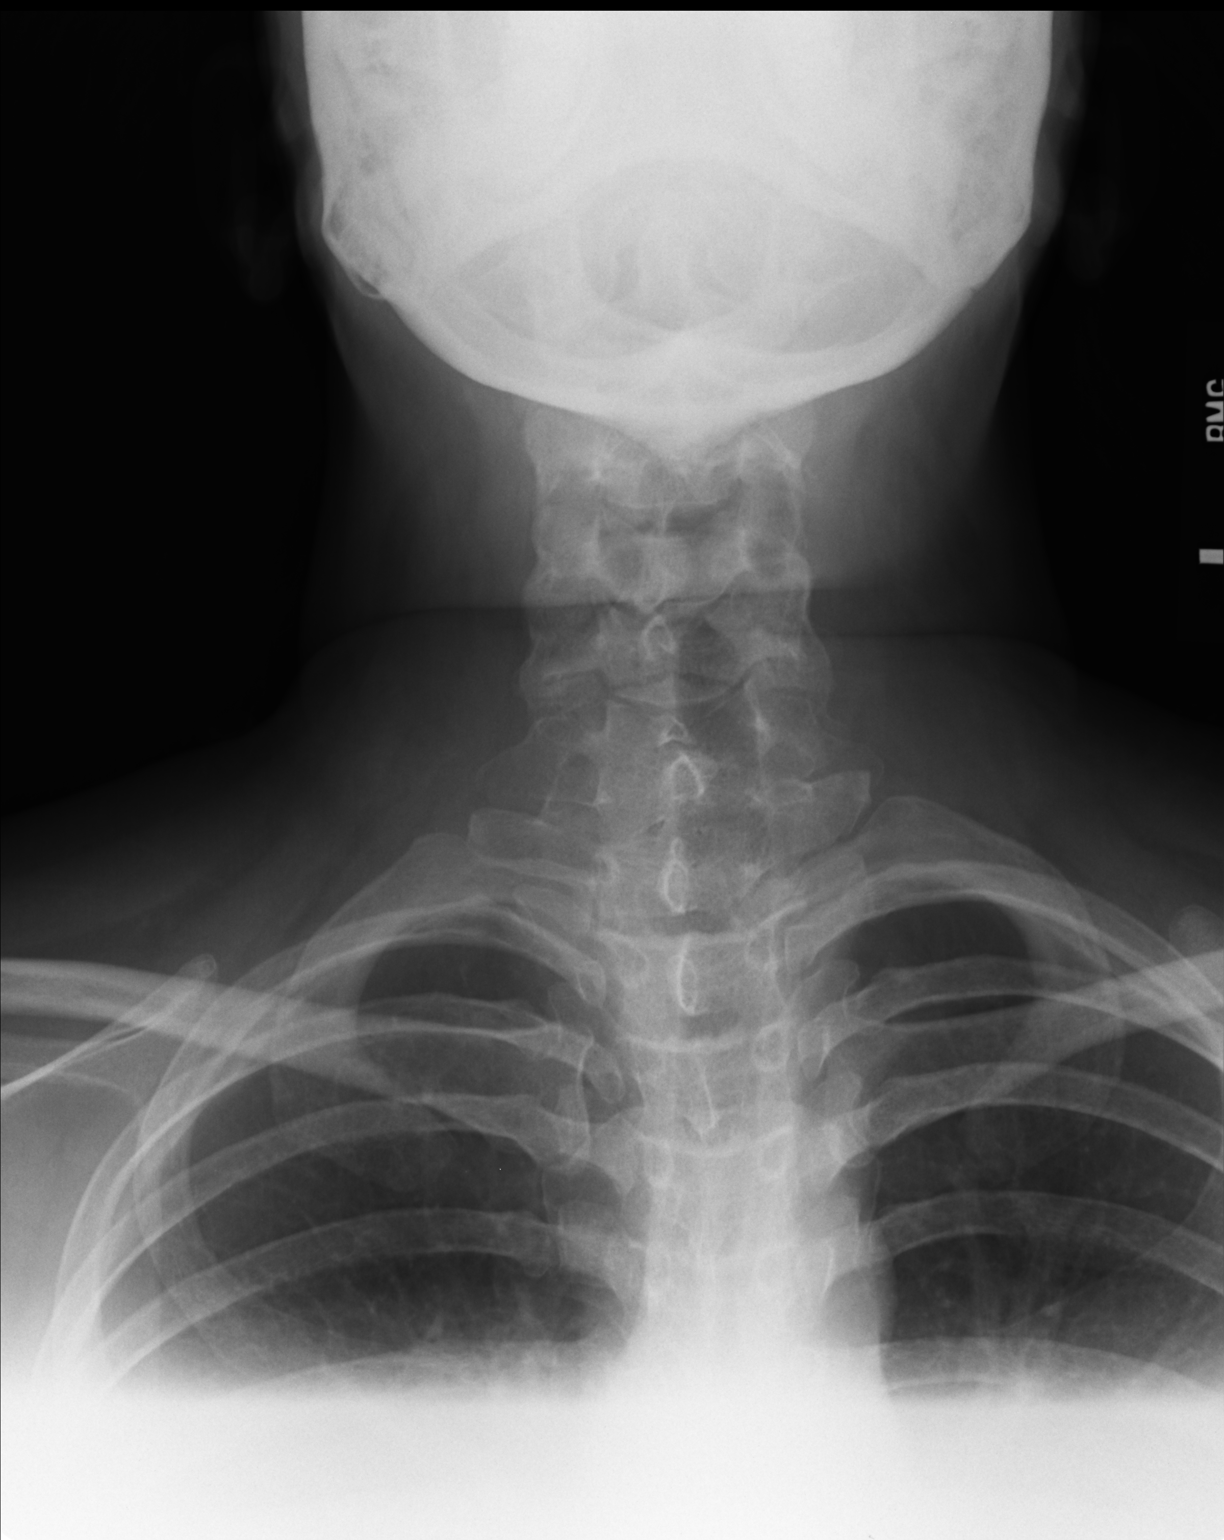

[2 of 2 positions shown; findings below may reference images not displayed]

FINDINGS: There is normal appearance of the prevertebral soft
tissues and subglottic contour.  Lung apices are clear.  Note is
made of a partially imaged dysmorphic C6, likely representing
congenital or developmental fusion of C6-7.
IMPRESSION: No evidence for acute abnormality of the soft tissues.

Clinically significant discrepancy from primary report, if
provided: None

## 2014-08-08 IMAGING — US US SOFT TISSUE HEAD/NECK
1 series · 14 of 25 positions shown · non-contrast
Comparison: None.

CLINICAL DATA: Soft tissue neck nodule.

THYROID ULTRASOUND
TECHNIQUE: Ultrasound examination of the thyroid gland and adjacent
soft tissues was performed.

[Series 1: us soft tissue head/neck · 0.08mm/px · 14 of 44 slices shown]
[im 1/44]
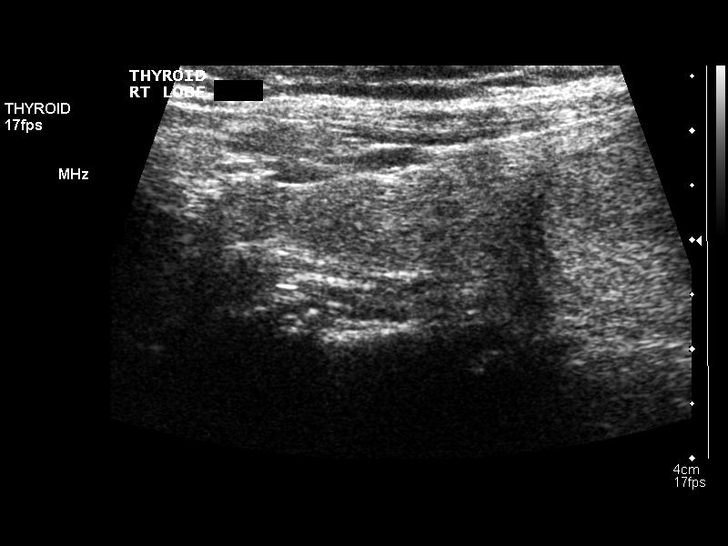
[im 4/44]
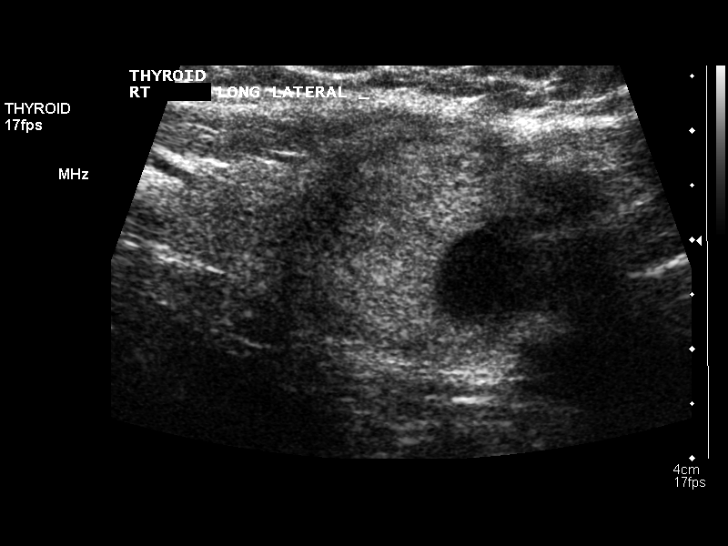
[im 8/44]
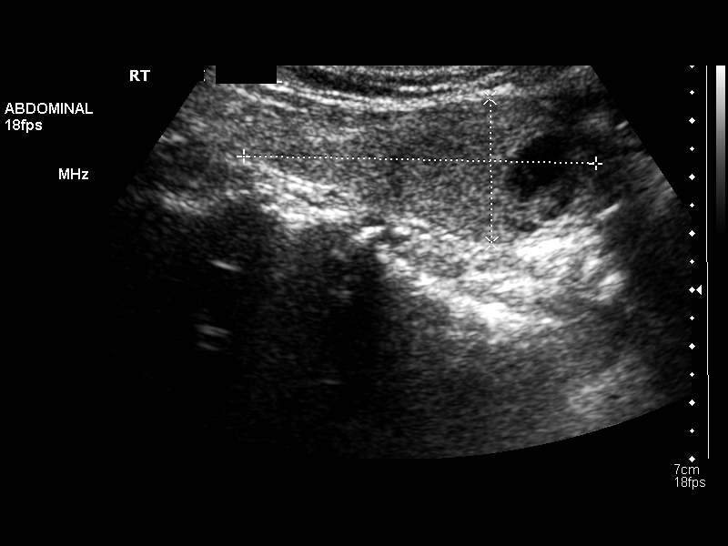
[im 11/44]
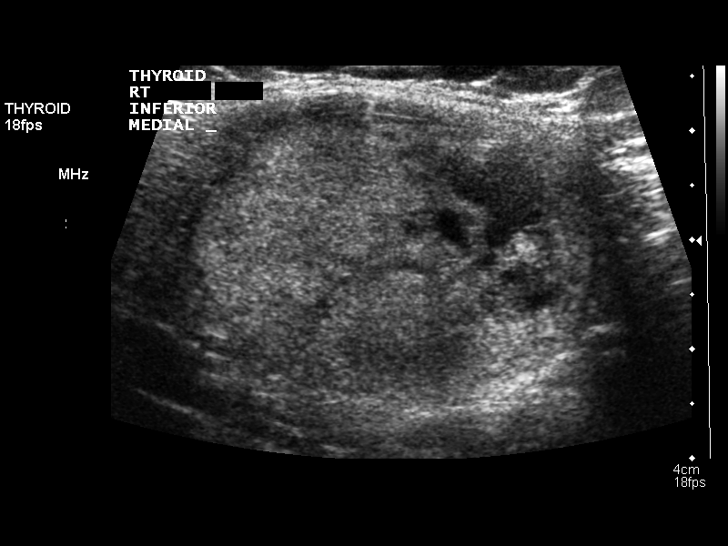
[im 15/44]
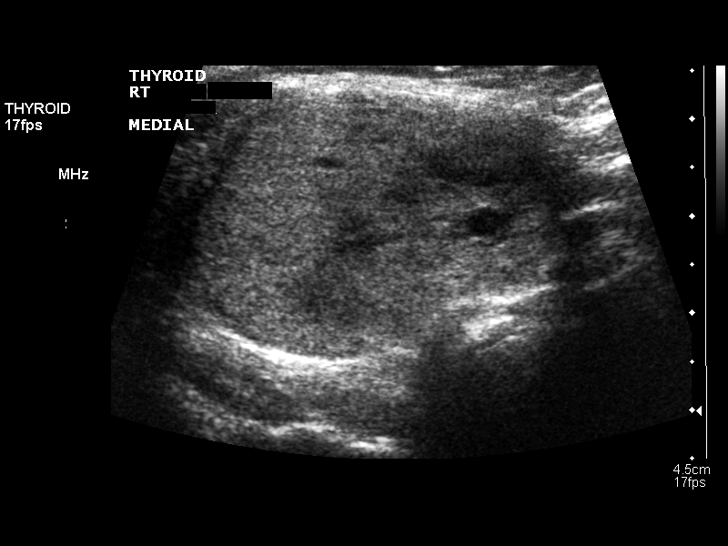
[im 17/44]
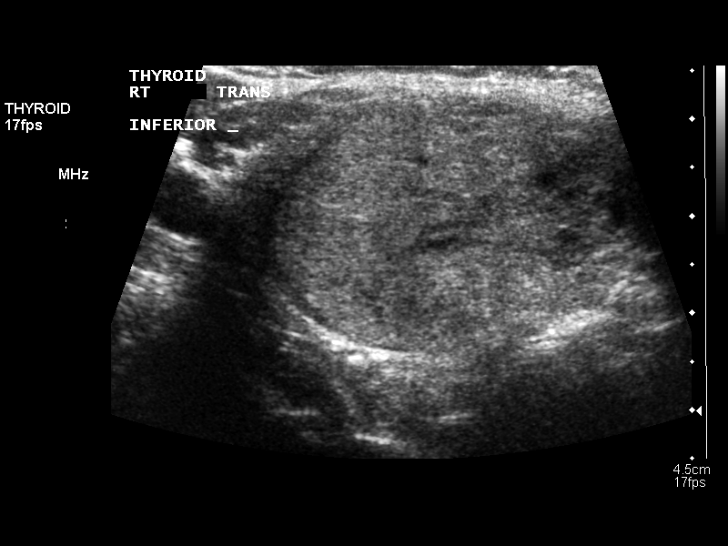
[im 20/44]
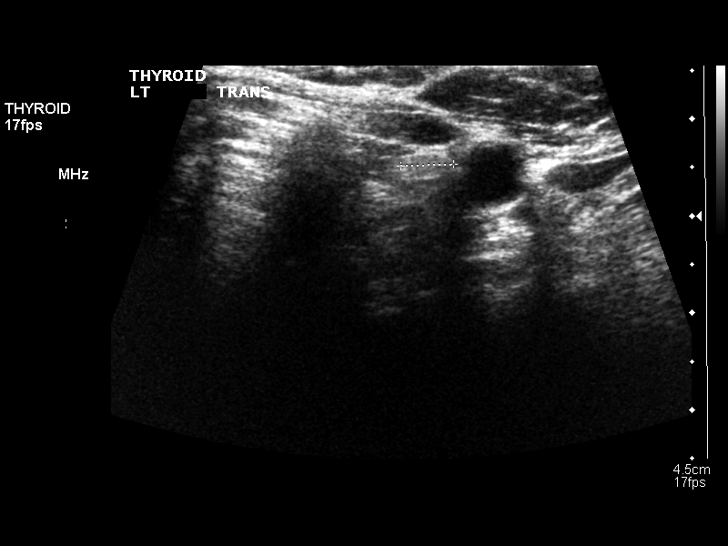
[im 24/44]
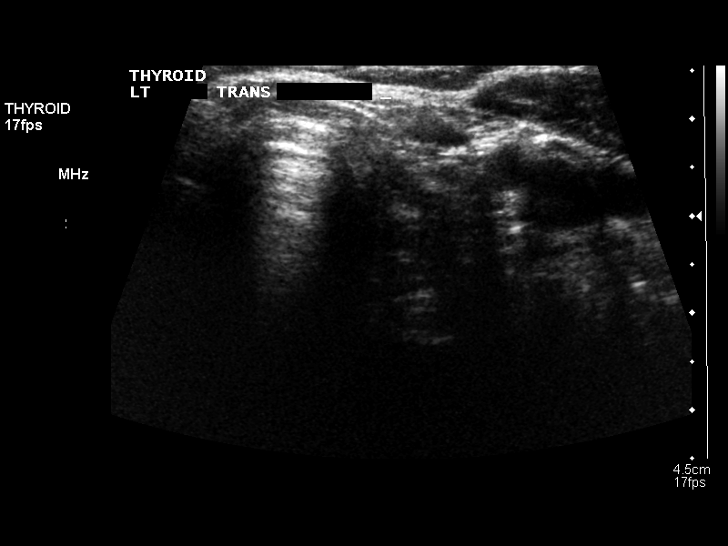
[im 27/44]
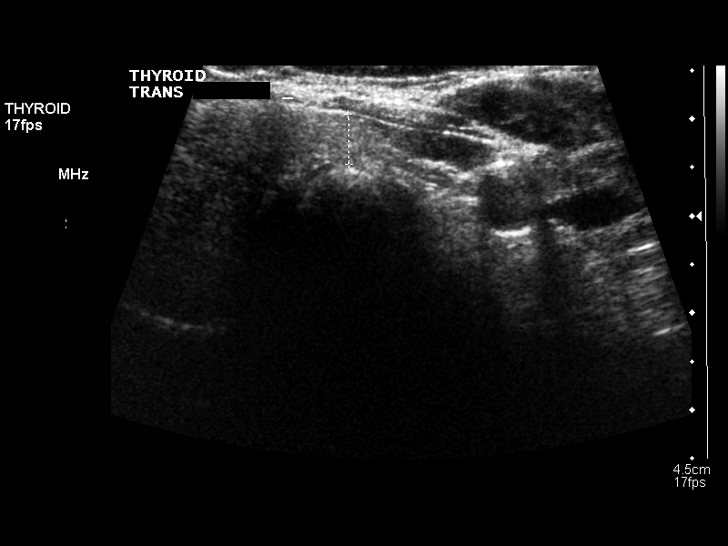
[im 29/44]
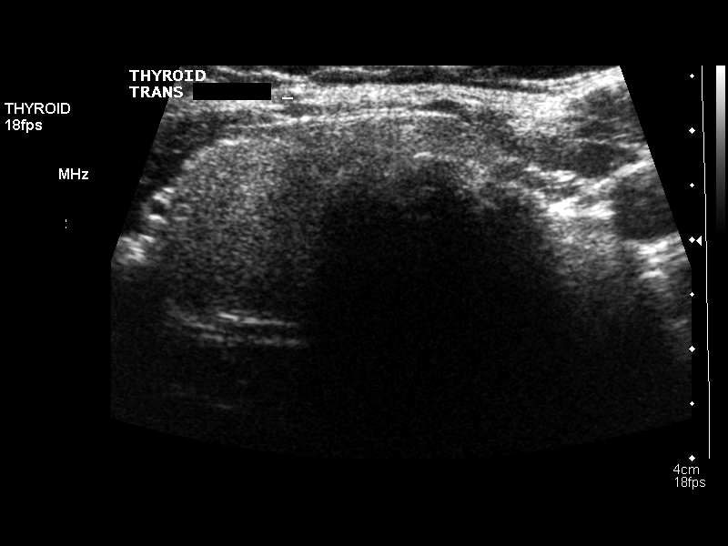
[im 33/44]
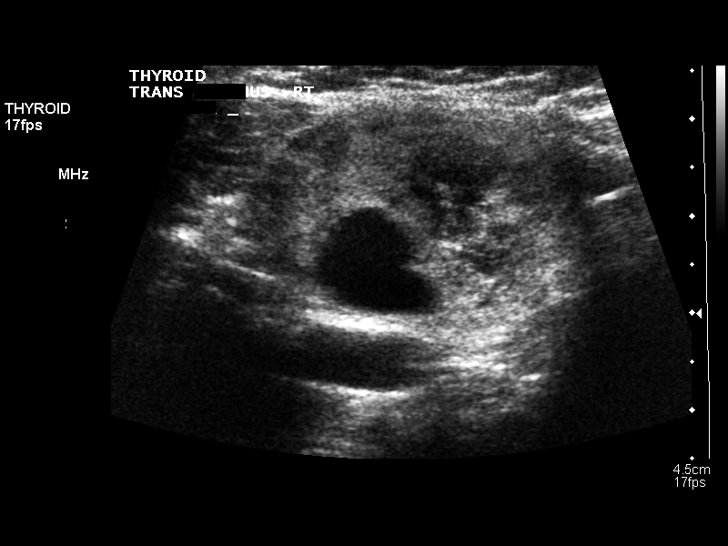
[im 36/44]
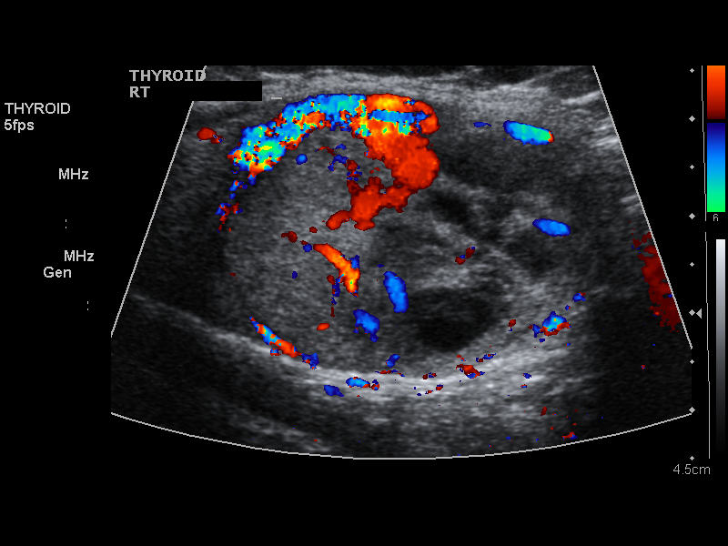
[im 40/44]
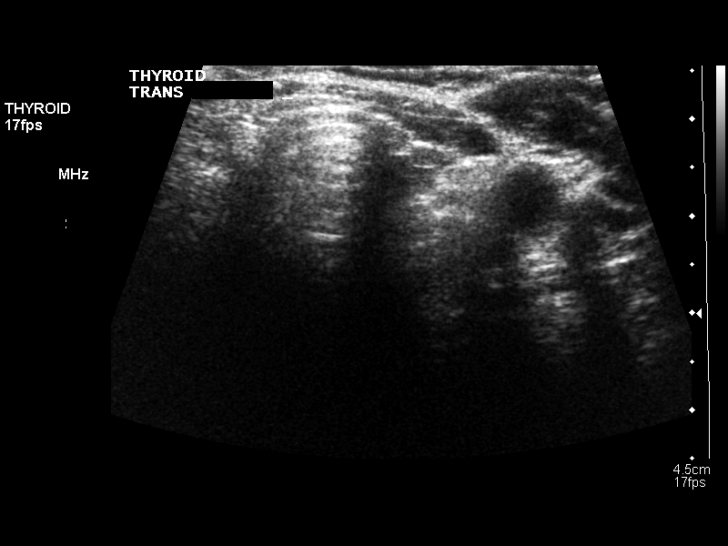
[im 44/44]
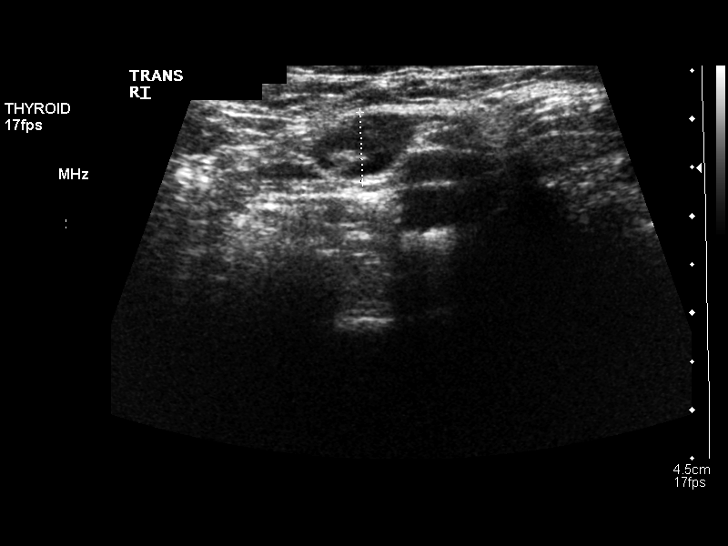

[14 of 25 positions shown; findings below may reference images not displayed]

FINDINGS: Right thyroid lobe:  Measures 6.3 x 2.6 x 1.7 cm.
Left thyroid lobe:  Not visualized.
Isthmus:  Not visualized.

Focal nodules:  Complex predominately solid nodule is noted in the
lower pole of the right thyroid lobe measuring 4.1 x 4.0 x 2.7 cm.

Lymphadenopathy:  Small sub centimeter lymph nodes are noted
bilaterally, with the largest measuring 7 mm.
IMPRESSION: 4.1 cm predominately solid nodule seen in the inferior portion of
right thyroid lobe; fine needle aspiration of this abnormality is
recommended.

## 2014-08-11 ENCOUNTER — Other Ambulatory Visit (HOSPITAL_COMMUNITY): Payer: Self-pay | Admitting: *Deleted

## 2014-08-11 NOTE — Pre-Procedure Instructions (Signed)
[name]  08/11/2014   Your procedure is scheduled on:  Tuesday, August 19, 2014 at 1:30 PM.   Report to [facility] Entrance "A" Admitting Office at 11:30 AM.   Call this number if you have problems the morning of surgery: [phone]               Any questions prior to day of surgery, please call [phone] between 8 & 4 PM.   Remember:   Do not eat food or drink liquids after midnight Monday, 08/18/14.   Take these medicines the morning of surgery with A SIP OF WATER: Levothyroxine (Synthroid), Loratadine (Claritin)   Do not wear jewelry, make-up or nail polish.  Do not wear lotions, powders, or perfumes. You may wear deodorant.  Do not shave 48 hours prior to surgery.   Do not bring valuables to the hospital.  [facility] is not responsible                  for any belongings or valuables.               Contacts, dentures or bridgework may not be worn into surgery.  Leave suitcase in the car. After surgery it may be brought to your room.  For patients admitted to the hospital, discharge time is determined by your                treatment team.               Special Instructions: See "Preparing for Surgery" Instruction sheet.     Please read over the following fact sheets that you were given: Pain Booklet, Coughing and Deep Breathing and Surgical Site Infection Prevention

## 2014-08-12 ENCOUNTER — Encounter (HOSPITAL_COMMUNITY)
Admission: RE | Admit: 2014-08-12 | Discharge: 2014-08-12 | Disposition: A | Discharge status: Home / Self Care | Payer: Commercial Managed Care - PPO | Source: Ambulatory Visit | Attending: General Surgery | Admitting: General Surgery

## 2014-08-12 ENCOUNTER — Encounter (HOSPITAL_COMMUNITY): Payer: Self-pay

## 2014-08-12 ENCOUNTER — Ambulatory Visit (HOSPITAL_COMMUNITY)
Admission: RE | Admit: 2014-08-12 | Discharge: 2014-08-12 | Disposition: A | Discharge status: Home / Self Care | Payer: Commercial Managed Care - PPO | Source: Ambulatory Visit | Attending: Anesthesiology | Admitting: Anesthesiology

## 2014-08-12 DIAGNOSIS — Z01812 Encounter for preprocedural laboratory examination: Secondary | ICD-10-CM | POA: Diagnosis not present

## 2014-08-12 DIAGNOSIS — Z01818 Encounter for other preprocedural examination: Secondary | ICD-10-CM | POA: Insufficient documentation

## 2014-08-12 DIAGNOSIS — E041 Nontoxic single thyroid nodule: Secondary | ICD-10-CM

## 2014-08-12 LAB — CBC
HCT: 40.1 % (ref 36.0–46.0)
Hemoglobin: 14.3 g/dL (ref 12.0–15.0)
MCH: 32.4 pg (ref 26.0–34.0)
MCHC: 35.7 g/dL (ref 30.0–36.0)
MCV: 90.7 fL (ref 78.0–100.0)
PLATELETS: 266 10*3/uL (ref 150–400)
RBC: 4.42 MIL/uL (ref 3.87–5.11)
RDW: 12.8 % (ref 11.5–15.5)
WBC: 9.7 10*3/uL (ref 4.0–10.5)

## 2014-08-12 LAB — HCG, SERUM, QUALITATIVE: Preg, Serum: NEGATIVE

## 2014-08-18 MED ORDER — CHLORHEXIDINE GLUCONATE 4 % EX LIQD: 1.0000 "application " | Freq: Once | CUTANEOUS | Status: DC

## 2014-08-18 MED ORDER — CEFAZOLIN SODIUM-DEXTROSE 2-3 GM-% IV SOLR
2.0000 g | INTRAVENOUS | Status: AC
  Administered 2014-08-19: 2 g via INTRAVENOUS
  Filled 2014-08-18: qty 50

## 2014-08-19 ENCOUNTER — Encounter (HOSPITAL_COMMUNITY): Payer: Self-pay | Admitting: *Deleted

## 2014-08-19 ENCOUNTER — Ambulatory Visit (HOSPITAL_COMMUNITY): Payer: Commercial Managed Care - PPO | Admitting: Anesthesiology

## 2014-08-19 ENCOUNTER — Observation Stay (HOSPITAL_COMMUNITY)
Admission: RE | Admit: 2014-08-19 | Discharge: 2014-08-20 | Disposition: A | Discharge status: Home / Self Care | Payer: Commercial Managed Care - PPO | Source: Ambulatory Visit | Attending: General Surgery | Admitting: General Surgery

## 2014-08-19 ENCOUNTER — Encounter (HOSPITAL_COMMUNITY)
Admission: RE | Disposition: A | Discharge status: Home / Self Care | Payer: Self-pay | Source: Ambulatory Visit | Attending: General Surgery

## 2014-08-19 DIAGNOSIS — E041 Nontoxic single thyroid nodule: Secondary | ICD-10-CM | POA: Diagnosis present

## 2014-08-19 DIAGNOSIS — Z87891 Personal history of nicotine dependence: Secondary | ICD-10-CM | POA: Diagnosis not present

## 2014-08-19 DIAGNOSIS — D34 Benign neoplasm of thyroid gland: Principal | ICD-10-CM | POA: Insufficient documentation

## 2014-08-19 DIAGNOSIS — Z6830 Body mass index (BMI) 30.0-30.9, adult: Secondary | ICD-10-CM | POA: Insufficient documentation

## 2014-08-19 DIAGNOSIS — E039 Hypothyroidism, unspecified: Secondary | ICD-10-CM | POA: Diagnosis not present

## 2014-08-19 DIAGNOSIS — E890 Postprocedural hypothyroidism: Secondary | ICD-10-CM

## 2014-08-19 HISTORY — PX: THYROIDECTOMY: SHX17

## 2014-08-19 SURGERY — THYROIDECTOMY
Anesthesia: General | Site: Neck

## 2014-08-19 MED ORDER — DEXAMETHASONE SODIUM PHOSPHATE 4 MG/ML IJ SOLN
INTRAMUSCULAR | Status: AC
  Filled 2014-08-19: qty 1

## 2014-08-19 MED ORDER — FENTANYL CITRATE 0.05 MG/ML IJ SOLN
INTRAMUSCULAR | Status: DC | PRN
  Administered 2014-08-19 (×2): 50 ug via INTRAVENOUS
  Administered 2014-08-19: 100 ug via INTRAVENOUS
  Administered 2014-08-19: 50 ug via INTRAVENOUS

## 2014-08-19 MED ORDER — ONDANSETRON HCL 4 MG/2ML IJ SOLN
INTRAMUSCULAR | Status: DC | PRN
  Administered 2014-08-19: 4 mg via INTRAVENOUS

## 2014-08-19 MED ORDER — HYDROMORPHONE HCL 1 MG/ML IJ SOLN
0.2500 mg | INTRAMUSCULAR | Status: DC | PRN
  Administered 2014-08-19: 1 mg via INTRAVENOUS

## 2014-08-19 MED ORDER — FENTANYL CITRATE 0.05 MG/ML IJ SOLN
INTRAMUSCULAR | Status: AC
  Filled 2014-08-19: qty 5

## 2014-08-19 MED ORDER — PROPOFOL 10 MG/ML IV BOLUS
INTRAVENOUS | Status: AC
  Filled 2014-08-19: qty 20

## 2014-08-19 MED ORDER — NEOSTIGMINE METHYLSULFATE 10 MG/10ML IV SOLN
INTRAVENOUS | Status: AC
  Filled 2014-08-19: qty 1

## 2014-08-19 MED ORDER — 0.9 % SODIUM CHLORIDE (POUR BTL) OPTIME
TOPICAL | Status: DC | PRN
  Administered 2014-08-19: 1000 mL

## 2014-08-19 MED ORDER — SCOPOLAMINE 1 MG/3DAYS TD PT72
MEDICATED_PATCH | TRANSDERMAL | Status: DC | PRN
  Administered 2014-08-19: 1 via TRANSDERMAL

## 2014-08-19 MED ORDER — OXYCODONE HCL 5 MG PO TABS: 5.0000 mg | ORAL_TABLET | Freq: Once | ORAL | Status: DC | PRN

## 2014-08-19 MED ORDER — PROPOFOL 10 MG/ML IV BOLUS
INTRAVENOUS | Status: DC | PRN
  Administered 2014-08-19: 100 mg via INTRAVENOUS
  Administered 2014-08-19: 150 mg via INTRAVENOUS

## 2014-08-19 MED ORDER — HYDROCODONE-ACETAMINOPHEN 5-325 MG PO TABS
1.0000 | ORAL_TABLET | ORAL | Status: DC | PRN
  Administered 2014-08-19 – 2014-08-20 (×3): 2 via ORAL
  Filled 2014-08-19 (×3): qty 2

## 2014-08-19 MED ORDER — LIDOCAINE HCL (CARDIAC) 20 MG/ML IV SOLN
INTRAVENOUS | Status: AC
  Filled 2014-08-19: qty 15

## 2014-08-19 MED ORDER — PROMETHAZINE HCL 25 MG/ML IJ SOLN: 6.2500 mg | INTRAMUSCULAR | Status: DC | PRN

## 2014-08-19 MED ORDER — MIDAZOLAM HCL 5 MG/5ML IJ SOLN
INTRAMUSCULAR | Status: DC | PRN
  Administered 2014-08-19: 2 mg via INTRAVENOUS

## 2014-08-19 MED ORDER — GLYCOPYRROLATE 0.2 MG/ML IJ SOLN
INTRAMUSCULAR | Status: AC
  Filled 2014-08-19: qty 3

## 2014-08-19 MED ORDER — DEXTROSE-NACL 5-0.9 % IV SOLN: INTRAVENOUS | Status: DC

## 2014-08-19 MED ORDER — ONDANSETRON HCL 4 MG/2ML IJ SOLN: 4.0000 mg | Freq: Four times a day (QID) | INTRAMUSCULAR | Status: DC | PRN

## 2014-08-19 MED ORDER — OXYCODONE HCL 5 MG/5ML PO SOLN: 5.0000 mg | Freq: Once | ORAL | Status: DC | PRN

## 2014-08-19 MED ORDER — LIDOCAINE HCL 4 % MT SOLN
OROMUCOSAL | Status: DC | PRN
  Administered 2014-08-19: 5 mL via TOPICAL

## 2014-08-19 MED ORDER — BUPIVACAINE HCL (PF) 0.25 % IJ SOLN
INTRAMUSCULAR | Status: AC
  Filled 2014-08-19: qty 30

## 2014-08-19 MED ORDER — LIDOCAINE HCL (CARDIAC) 20 MG/ML IV SOLN
INTRAVENOUS | Status: DC | PRN
  Administered 2014-08-19: 100 mg via INTRAVENOUS

## 2014-08-19 MED ORDER — LACTATED RINGERS IV SOLN
INTRAVENOUS | Status: DC | PRN
  Administered 2014-08-19: 15:00:00 via INTRAVENOUS

## 2014-08-19 MED ORDER — HYDROMORPHONE HCL 1 MG/ML IJ SOLN
1.0000 mg | INTRAMUSCULAR | Status: DC | PRN
  Administered 2014-08-19 – 2014-08-20 (×4): 1 mg via INTRAVENOUS
  Filled 2014-08-19 (×4): qty 1

## 2014-08-19 MED ORDER — SCOPOLAMINE 1 MG/3DAYS TD PT72
MEDICATED_PATCH | TRANSDERMAL | Status: AC
  Filled 2014-08-19: qty 1

## 2014-08-19 MED ORDER — DEXAMETHASONE SODIUM PHOSPHATE 4 MG/ML IJ SOLN
INTRAMUSCULAR | Status: DC | PRN
  Administered 2014-08-19: 4 mg via INTRAVENOUS

## 2014-08-19 MED ORDER — HYDROMORPHONE HCL 1 MG/ML IJ SOLN
INTRAMUSCULAR | Status: AC
  Filled 2014-08-19: qty 1

## 2014-08-19 MED ORDER — PHENYLEPHRINE HCL 10 MG/ML IJ SOLN
INTRAMUSCULAR | Status: DC | PRN
  Administered 2014-08-19: 80 ug via INTRAVENOUS

## 2014-08-19 MED ORDER — EPHEDRINE SULFATE 50 MG/ML IJ SOLN
INTRAMUSCULAR | Status: DC | PRN
  Administered 2014-08-19: 5 mg via INTRAVENOUS
  Administered 2014-08-19: 10 mg via INTRAVENOUS

## 2014-08-19 MED ORDER — MIDAZOLAM HCL 2 MG/2ML IJ SOLN
INTRAMUSCULAR | Status: AC
  Filled 2014-08-19: qty 2

## 2014-08-19 MED ORDER — HEMOSTATIC AGENTS (NO CHARGE) OPTIME
TOPICAL | Status: DC | PRN
  Administered 2014-08-19: 1 via TOPICAL

## 2014-08-19 MED ORDER — LACTATED RINGERS IV SOLN
INTRAVENOUS | Status: DC
  Administered 2014-08-19: 13:00:00 via INTRAVENOUS

## 2014-08-19 SURGICAL SUPPLY — 50 items
BLADE SURG 15 STRL LF DISP TIS (BLADE) ×1 IMPLANT
BLADE SURG 15 STRL SS (BLADE) ×2
BLADE SURG ROTATE 9660 (MISCELLANEOUS) IMPLANT
CANISTER SUCTION 2500CC (MISCELLANEOUS) ×3 IMPLANT
CLIP TI MEDIUM 24 (CLIP) ×3 IMPLANT
CLIP TI WIDE RED SMALL 24 (CLIP) ×3 IMPLANT
CONT SPEC 4OZ CLIKSEAL STRL BL (MISCELLANEOUS) IMPLANT
COVER SURGICAL LIGHT HANDLE (MISCELLANEOUS) ×3 IMPLANT
CRADLE DONUT ADULT HEAD (MISCELLANEOUS) ×3 IMPLANT
DRAPE PED LAPAROTOMY (DRAPES) ×3 IMPLANT
DRAPE UTILITY XL STRL (DRAPES) ×6 IMPLANT
ELECT COATED BLADE 2.86 ST (ELECTRODE) ×3 IMPLANT
ELECT REM PT RETURN 9FT ADLT (ELECTROSURGICAL) ×3
ELECTRODE REM PT RTRN 9FT ADLT (ELECTROSURGICAL) ×1 IMPLANT
GAUZE SPONGE 4X4 12PLY STRL (GAUZE/BANDAGES/DRESSINGS) ×3 IMPLANT
GAUZE SPONGE 4X4 16PLY XRAY LF (GAUZE/BANDAGES/DRESSINGS) ×3 IMPLANT
GLOVE BIO SURGEON STRL SZ7 (GLOVE) ×3 IMPLANT
GLOVE BIO SURGEON STRL SZ7.5 (GLOVE) ×3 IMPLANT
GLOVE BIOGEL PI IND STRL 6.5 (GLOVE) ×1 IMPLANT
GLOVE BIOGEL PI IND STRL 7.0 (GLOVE) ×1 IMPLANT
GLOVE BIOGEL PI INDICATOR 6.5 (GLOVE) ×2
GLOVE BIOGEL PI INDICATOR 7.0 (GLOVE) ×2
GOWN STRL REUS W/ TWL LRG LVL3 (GOWN DISPOSABLE) ×1 IMPLANT
GOWN STRL REUS W/ TWL XL LVL3 (GOWN DISPOSABLE) ×1 IMPLANT
GOWN STRL REUS W/TWL LRG LVL3 (GOWN DISPOSABLE) ×2
GOWN STRL REUS W/TWL XL LVL3 (GOWN DISPOSABLE) ×2
HEMOSTAT SURGICEL 2X4 FIBR (HEMOSTASIS) ×3 IMPLANT
KIT BASIN OR (CUSTOM PROCEDURE TRAY) ×3 IMPLANT
KIT ROOM TURNOVER OR (KITS) ×3 IMPLANT
LIQUID BAND (GAUZE/BANDAGES/DRESSINGS) ×3 IMPLANT
NEEDLE HYPO 25GX1X1/2 BEV (NEEDLE) ×3 IMPLANT
NS IRRIG 1000ML POUR BTL (IV SOLUTION) ×3 IMPLANT
PACK SURGICAL SETUP 50X90 (CUSTOM PROCEDURE TRAY) ×3 IMPLANT
PAD ARMBOARD 7.5X6 YLW CONV (MISCELLANEOUS) ×3 IMPLANT
PENCIL BUTTON HOLSTER BLD 10FT (ELECTRODE) ×3 IMPLANT
SHEARS HARMONIC 9CM CVD (BLADE) ×3 IMPLANT
SPECIMEN JAR MEDIUM (MISCELLANEOUS) ×3 IMPLANT
SPONGE INTESTINAL PEANUT (DISPOSABLE) ×3 IMPLANT
SUT MNCRL AB 4-0 PS2 18 (SUTURE) ×3 IMPLANT
SUT SILK 2 0 (SUTURE) ×2
SUT SILK 2-0 18XBRD TIE 12 (SUTURE) ×1 IMPLANT
SUT SILK 3 0 (SUTURE) ×2
SUT SILK 3-0 18XBRD TIE 12 (SUTURE) ×1 IMPLANT
SUT VIC AB 3-0 SH 18 (SUTURE) ×3 IMPLANT
SYR BULB 3OZ (MISCELLANEOUS) ×3 IMPLANT
SYR CONTROL 10ML LL (SYRINGE) ×3 IMPLANT
TOWEL OR 17X24 6PK STRL BLUE (TOWEL DISPOSABLE) ×3 IMPLANT
TOWEL OR 17X26 10 PK STRL BLUE (TOWEL DISPOSABLE) ×3 IMPLANT
TUBE CONNECTING 12'X1/4 (SUCTIONS) ×1
TUBE CONNECTING 12X1/4 (SUCTIONS) ×2 IMPLANT

## 2014-08-19 NOTE — H&P (Signed)
History of Present Illness Connie Ok MD; 07/14/2014 9:01 AM) Patient words: thyroid mass.  The patient is a 39 year old female who presents with a thyroid nodule. The patient is a 39 year old female who is referred by Dr. Loanne Gill for an evaluation of a right thyroid nodule. Patient was diagnosed with a goiter approximately year ago. Patient also had a 4 cm nodule in the right lobe of thyroid which was FNA. FNA revealed suspicion for follicular neoplasm. Patient subsequently underwent radioactive iodine treatment for her goiter. Present most recent ultrasound this did show a decrease in the goiter however the patient has had some symptoms of pressure in her neck. Most recent ultrasound ulcer reveals the right side nodule has shrunk to approximately 2.3 cm. Patient does have some small thyroid tissue on the left side. Patient has been undergoing Synthroid treatment.    Allergies [name], [place]; 07/14/2014 8:52 AM) [name].  Medication History [name], [place]; 07/14/2014 8:52 AM) Levothyroxine Sodium (125MCG Tablet, Oral) Active.  Vitals [name] [place] MA; 07/14/2014 8:48 AM) 07/14/2014 8:48 AM Weight: 169.6 lb Height: 63in Body Surface Area: 1.85 m Body Mass Index: 30.04 kg/m Temp.: 97.67F(Temporal)  Resp.: 16 (Unlabored)  BP: 108/70 (Sitting, Left Arm, Standard)    Physical Exam Connie Ok MD; 07/14/2014 9:01 AM) General Mental Status-Alert. General Appearance-Consistent with stated age. Hydration-Well hydrated. Voice-Normal.  Head and Neck Thyroid  Gland Characteristics: There is a palpable mass/nodule found and described as follows: - The location of the mass/nodule is the - right lobe, lower pole.  Chest and Lung Exam Chest and lung exam reveals -quiet, even and easy respiratory effort with no use of accessory muscles and on auscultation, normal breath sounds, no adventitious sounds and normal vocal  resonance. Inspection Chest Wall - Normal. Back - normal.  Cardiovascular Cardiovascular examination reveals -normal heart sounds, regular rate and rhythm with no murmurs and normal pedal pulses bilaterally.  Abdomen Inspection Inspection of the abdomen reveals - No Hernias. Skin - Scar - no surgical scars. Palpation/Percussion Palpation and Percussion of the abdomen reveal - Soft, Non Tender, No Rebound tenderness, No Rigidity (guarding) and No hepatosplenomegaly. Auscultation Auscultation of the abdomen reveals - Bowel sounds normal.  Neurologic Neurologic evaluation reveals -alert and oriented x 3 with no impairment of recent or remote memory. Mental Status-Normal.    Assessment & Plan Connie Ok MD; 07/14/2014 9:03 AM) THYROID NODULE (241.0  E04.1) Impression: 39 year old female with a right thyroid nodule  1. Will proceed to the operative for total thyroidectomy. This is due secondary to the fact the patient had radioactive iodine and is on Synthroid treatment at this time. 2.All risks and benefits were discussed with the patient to generally include: infection, bleeding, damage to the recurrent laryngeal nerve, damage to parathyroid glands, and possible need for further surgery. Alternatives were offered and described. All questions were answered and the patient voiced understanding of the procedure and wishes to proceed.

## 2014-08-19 NOTE — Progress Notes (Addendum)
[facility] was told I would be called back by on call provider, left my direct contact number, CN Shinita and myself unable to find provider to contact for patient admission to 5W.

## 2014-08-19 NOTE — Transfer of Care (Signed)
Immediate Anesthesia Transfer of Care Note  Patient: [name]  Procedure(s) Performed: Procedure(s): TOTAL THYROIDECTOMY (N/A)  Patient Location: PACU  Anesthesia Type:General  Level of Consciousness: awake, alert  and oriented  Airway & Oxygen Therapy: Patient Spontanous Breathing and Patient connected to nasal cannula oxygen  Post-op Assessment: Report given to RN and Post -op Vital signs reviewed and stable  Post vital signs: Reviewed and stable  Last Vitals:  Filed Vitals:   08/19/14 1549  BP: 135/75  Pulse: 101  Temp: 36.7 C  Resp: 21    Complications: No apparent anesthesia complications

## 2014-08-19 NOTE — Progress Notes (Signed)
Spoke with covering provider from [facility], whom stated he would saline lock patient. I held the D5NS order and still have the LR running from when patient was originally admitted until these orders are dc'd.

## 2014-08-19 NOTE — Discharge Instructions (Signed)
Thyroidectomy °Care After °Refer to this sheet in the next few weeks. These instructions provide you with general information on caring for yourself after you leave the hospital. Your caregiver also may give you specific instructions. Your treatment has been planned according to the most current medical practices available, but problems sometimes occur. Call your caregiver if you have any problems or questions after your procedure. °HOME CARE INSTRUCTIONS  °· It is normal to be sore for a few weeks following surgery. See your caregiver if your pain seems to be getting worse rather than better. °· Only take over-the-counter or prescription medicines for pain, discomfort, or fever as directed by your caregiver. Avoid taking medicines that contain aspirin and ibuprofen because they increase the risk of bleeding. °· Shower rather than bathe until instructed otherwise by your caregiver. °· Change your bandages (dressings) as directed by your caregiver. °· You may resume a normal diet and activities as directed by your caregiver. °· Avoid lifting weight greater than 20 lb (9 kg) or participating in heavy exercise or contact sports for 10 days or as instructed by your caregiver. °· Make an appointment to see your caregiver for stitch (suture) or staple removal. °SEEK MEDICAL CARE IF:  °· You have increased bleeding from your wound. °· You have redness, swelling, or increasing pain from your wound or in your neck. °· There is pus coming from your wound. °· You have an oral temperature above 102° F (38.9° C). °· There is a bad smell coming from the wound or dressing. °· You develop lightheadedness or feel faint. °· You develop numbness, tingling, or muscle spasms in your arms, hands, feet, or face. °· You have difficulty swallowing. °SEEK IMMEDIATE MEDICAL CARE IF:  °· You develop a rash. °· You have difficulty breathing. °· You hear whistling noises that come from your chest. °· You develop a cough that becomes increasingly  worse. °· You develop any reaction or side effects to medicines given. °· There is swelling in your neck. °· You develop changes in speech or hoarseness, which is getting worse. °MAKE SURE YOU:  °· Understand these instructions. °· Will watch your condition. °· Will get help right away if you are not doing well or get worse. °Document Released: 11/12/2004 Document Revised: 07/18/2011 Document Reviewed: 07/02/2010 °ExitCare® Patient Information ©2015 [employer]. This information is not intended to replace advice given to you by your health care provider. Make sure you discuss any questions you have with your health care provider. ° °

## 2014-08-19 NOTE — Anesthesia Procedure Notes (Signed)
Procedure Name: Intubation Date/Time: 08/19/2014 2:40 PM Performed by: Ollen Bowl Pre-anesthesia Checklist: Patient identified, Emergency Drugs available, Suction available, Patient being monitored and Timeout performed Patient Re-evaluated:Patient Re-evaluated prior to inductionOxygen Delivery Method: Circle system utilized and Simple face mask Preoxygenation: Pre-oxygenation with 100% oxygen Intubation Type: IV induction Ventilation: Mask ventilation without difficulty Laryngoscope Size: Mac and 3 Grade View: Grade I Tube type: Oral Tube size: 7.5 mm Number of attempts: 1 Airway Equipment and Method: Patient positioned with wedge pillow and Stylet Placement Confirmation: ETT inserted through vocal cords under direct vision,  positive ETCO2 and breath sounds checked- equal and bilateral Secured at: 21 cm Tube secured with: Tape Dental Injury: Teeth and Oropharynx as per pre-operative assessment

## 2014-08-19 NOTE — Progress Notes (Signed)
NURSING PROGRESS NOTE  [name] [id number] Admission Data: 08/19/2014 5:17 PM Attending Provider: [name], MD PJK:DTOIZTIW, [place], FNP Code Status: Full Allergies:  Allegra Past Medical History:   has a past medical history of Thyroid nodule and Hypothyroidism. Past Surgical History:   has past surgical history that includes Cesarean section and Tubal ligation. Social History:   reports that she has quit smoking. Her smoking use included Cigarettes. She smoked 2.00 packs per day. She quit smokeless tobacco use about 22 months ago. She reports that she drinks about 3.0 oz of alcohol per week. She reports that she does not use illicit drugs.  [name] is a 39 y.o. female patient admitted from ED:   Last Documented Vital Signs: Blood pressure 121/77, pulse 72, temperature 98.5 F (36.9 C), temperature source Oral, resp. rate 18, height 5\' 3"  (1.6 m), weight 77.565 kg (171 lb), last menstrual period 08/10/2014, SpO2 95 %.  Cardiac Monitoring:  None ordered.  IV Fluids:  IV in place, occlusive dsg intact without redness, IV cath hand left, condition patent and no redness D5W/0.9 NaCl.   Skin: Surgical incision lateral neck super glued, clean dry intact, no bleeding at this time  Patient/Family orientated to room. Information packet given to patient/family. Admission inpatient armband information verified with patient/family to include name and date of birth and placed on patient arm. Side rails up x 2, fall assessment and education completed with patient/family. Patient/family able to verbalize understanding of risk associated with falls and verbalized understanding to call for assistance before getting out of bed. Call light within reach. Patient/family able to voice and demonstrate understanding of unit orientation instructions.    Will continue to evaluate and treat per MD orders.   Hendricks Limes RN, BS, BSN

## 2014-08-19 NOTE — Op Note (Signed)
08/19/2014   PATIENT:  [name]  39 y.o. female  PRE-OPERATIVE DIAGNOSIS:  RIGHT THYROID NODULE  POST-OPERATIVE DIAGNOSIS:  same  PROCEDURE:  Procedure(s): TOTAL THYROIDECTOMY (N/A)  SURGEON:  Surgeon(s) and Role:    * [name], MD - Primary  ASSISTANTS: Dr. Jackolyn Confer   ANESTHESIA:   local and general  EBL:     BLOOD ADMINISTERED:none  DRAINS: none   LOCAL MEDICATIONS USED:  BUPIVICAINE   SPECIMEN:  Source of Specimen:  Total thyroid with sitich in the R superior lobe  DISPOSITION OF SPECIMEN:  PATHOLOGY  COUNTS:  YES  TOURNIQUET:  * No tourniquets in log *  DICTATION: .Dragon Dictation   Findings: right inferior  Thyroid nodule. Small left  thyroid  Indications for procedure: The patient is a 39 yo F who had a R thyroid nodule. Pt also complained of dysphagia.  She had previously been given for goiter with Radioactive I2.  Details of the procedure:  The patient was taken back to the operating room. The patient was placed in supine position with bilateral SCDs in place.  The patient was prepped and draped in the usual sterile fashion. After appropriate anitbiotics were confirmed, a time-out was confirmed and all facts were verified. A 4 cm incision was made approximately 2 fingerbreadths above the sternal notch. Bovie cautery was used to maintain hemostasis dissection was carried down through the platysma. The platysma was elevated and flaps were created superiorly and inferiorly to the thyroid cartilage as well as the sternal notch, repsectively. The strap muscles were identified in the midline and separated. Right-sided strap muscles were elevated off the anterior surface of the thyroid. This dissection was carried laterally. The middle thyroid vein was identified and doubly ligated. We proceeded to dissect away the superior lobe and Kitners were used to gently dissect the surrounding musculature from the thyroid. The superior thyroid vessels were identified  and doubly ligated with clips and divided wi a Harmonic. At this time this freed up the superior lobe was able to deliver this into the wound. Immediately following. We continued to dissect the thyroid off of the trachea from lateral to medial direction. The right recurrent laryngeal nerve was identified and protected. We proceeded to dissect the thyroid inferiorly and inferior parathyroid was identified and preserved. The inferior thyroid vessels were identified and doubly ligated with clips. At this time Berry's ligament was dissected away from the trachea. This deliver the right lobe of the thyroid into the wound.  We proceeded to expose the left thyroid lobe.  The left strap muscles were dissected off.  The left lobe was atretic and small.  The left superior lobe was easily dissected away from the surrounding tissue.  The left side was near the midline of the trachea.  This was easily divided away from the surrounding tissues using Harmonic scalpel   A superior stitch was then placed in the superior Right thyroid lobe. A small stitch was placed in the left superior lobe.  The area was irrigated out. The dissection bed was hemostatic. We placed fibrillar hemostatic agent into the wound. Strap muscles were then reapproximated in the midline with interrupted 3-0 Vicryl stitches. The platysma was reapproximated using 3-0 Vicryl stitches in interrupted fashion. Skin was then reapproximated using a running subcuticular 4-0 Monocryl. The skin was then dressed with Dermabond. The patient was taken to the recovery room in stable condition.   PLAN OF CARE: Admit for overnight observation  PATIENT DISPOSITION:  PACU - hemodynamically  stable.   Delay start of Pharmacological VTE agent (>24hrs) due to surgical blood loss or risk of bleeding: not applicable

## 2014-08-19 NOTE — Anesthesia Preprocedure Evaluation (Addendum)
Anesthesia Evaluation  Patient identified by MRN, date of birth, ID band Patient awake    Reviewed: Allergy & Precautions, NPO status , Patient's Chart, lab work & pertinent test results  Airway Mallampati: III  TM Distance: >3 FB Neck ROM: Full    Dental  (+) Teeth Intact, Dental Advisory Given   Pulmonary former smoker,  breath sounds clear to auscultation        Cardiovascular negative cardio ROS  Rhythm:Regular Rate:Normal     Neuro/Psych negative neurological ROS     GI/Hepatic negative GI ROS, Neg liver ROS,   Endo/Other  Hypothyroidism Morbid obesity  Renal/GU negative Renal ROS     Musculoskeletal negative musculoskeletal ROS (+)   Abdominal   Peds  Hematology   Anesthesia Other Findings   Reproductive/Obstetrics                            Anesthesia Physical Anesthesia Plan  ASA: II  Anesthesia Plan: General   Post-op Pain Management:    Induction: Intravenous  Airway Management Planned: Oral ETT  Additional Equipment:   Intra-op Plan:   Post-operative Plan: Extubation in OR  Informed Consent: I have reviewed the patients History and Physical, chart, labs and discussed the procedure including the risks, benefits and alternatives for the proposed anesthesia with the patient or authorized representative who has indicated his/her understanding and acceptance.   Dental advisory given  Plan Discussed with: CRNA  Anesthesia Plan Comments:         Anesthesia Quick Evaluation

## 2014-08-20 ENCOUNTER — Encounter (HOSPITAL_COMMUNITY): Payer: Self-pay | Admitting: General Surgery

## 2014-08-20 DIAGNOSIS — D34 Benign neoplasm of thyroid gland: Secondary | ICD-10-CM | POA: Diagnosis not present

## 2014-08-20 LAB — CALCIUM: Calcium: 8.8 mg/dL (ref 8.4–10.5)

## 2014-08-20 MED ORDER — OXYCODONE-ACETAMINOPHEN 5-325 MG PO TABS: 1.0000 | ORAL_TABLET | ORAL | Status: DC | PRN

## 2014-08-20 NOTE — Anesthesia Postprocedure Evaluation (Signed)
  Anesthesia Post-op Note  Patient: [name]  Procedure(s) Performed: Procedure(s): TOTAL THYROIDECTOMY (N/A)  Patient Location: PACU  Anesthesia Type:General  Level of Consciousness: awake, alert , oriented and patient cooperative  Airway and Oxygen Therapy: Patient Spontanous Breathing  Post-op Pain: mild  Post-op Assessment: Post-op Vital signs reviewed, Patient's Cardiovascular Status Stable, Respiratory Function Stable, Patent Airway, No signs of Nausea or vomiting and Pain level controlled  Post-op Vital Signs: stable  Last Vitals:  Filed Vitals:   08/20/14 0611  BP: 101/71  Pulse: 67  Temp: 36.6 C  Resp: 16    Complications: No apparent anesthesia complications

## 2014-08-20 NOTE — Care Management Note (Signed)
    Page 1 of 1   08/20/2014     3:09:08 PM CARE MANAGEMENT NOTE 08/20/2014  Patient:  [name], [name]   Account Number:  [id number]  Date Initiated:  08/20/2014  Documentation initiated by:  Carles Collet  Subjective/Objective Assessment:   thyroid nodule, from home with spouse     Action/Plan:   will follow for any discharge needs   Anticipated DC Date:  08/20/2014   Anticipated DC Plan:  HOME/SELF CARE         Choice offered to / List presented to:             Status of service:  Completed, signed off Medicare Important Message given?   (If response is "NO", the following Medicare IM given date fields will be blank) Date Medicare IM given:   Medicare IM given by:   Date Additional Medicare IM given:   Additional Medicare IM given by:    Discharge Disposition:    Per UR Regulation:    If discussed at Long Length of Stay Meetings, dates discussed:    Comments:  08-20-14 will follow for any discharge needs, non assessed at this time. Carles Collet RN BSN CM

## 2014-08-20 NOTE — Progress Notes (Signed)
UR completed 

## 2014-08-20 NOTE — Discharge Summary (Signed)
Physician Discharge Summary  Patient ID: [name] MRN: [id number] DOB/AGE: [birth year] 39 y.o.  Admit date: 08/19/2014 Discharge date: 08/20/2014  Admission Diagnoses: s/p total thyroidectomy  Discharge Diagnoses:  Active Problems:   S/P total thyroidectomy   Discharged Condition: good  Hospital Course: PT was admitted post op.  She was started on PO pain rx and a diet.  She tolerated that well and had good pain control.  She was deemed stable for DC and Dc'd home.  Consults: None  Significant Diagnostic Studies: none  Treatments: surgery: as abovre   Discharge Exam: Blood pressure 101/71, pulse 67, temperature 97.8 F (36.6 C), temperature source Oral, resp. rate 16, height 5\' 3"  (1.6 m), weight 77.565 kg (171 lb), last menstrual period 08/10/2014, SpO2 58 %. Incision/Wound: c/d/i  Disposition:      Medication List    STOP taking these medications        loratadine 10 MG tablet  Commonly known as:  CLARITIN      TAKE these medications        levothyroxine 125 MCG tablet  Commonly known as:  SYNTHROID, LEVOTHROID  Take 1 tablet (125 mcg total) by mouth daily before breakfast.     oxyCODONE-acetaminophen 5-325 MG per tablet  Commonly known as:  ROXICET  Take 1-2 tablets by mouth every 4 (four) hours as needed.           Follow-up Information    Follow up with Reyes Ivan, MD. Schedule an appointment as soon as possible for a visit in 2 weeks.   Specialty:  General Surgery   Why:  For wound re-check   Contact information:   [address] [place] [address] [phone]       Signed: Rosario Jacks., Anne Hahn 08/20/2014, 8:22 AM

## 2014-08-20 NOTE — Progress Notes (Signed)
Nsg Discharge Note  Admit Date:  08/19/2014 Discharge date: 08/20/2014   [name] to be D/C'd Home per MD order.  AVS completed.  Copy for chart, and copy for patient signed, and dated. Patient/caregiver able to verbalize understanding.  Discharge Medication:   Medication List    STOP taking these medications        loratadine 10 MG tablet  Commonly known as:  CLARITIN      TAKE these medications        levothyroxine 125 MCG tablet  Commonly known as:  SYNTHROID, LEVOTHROID  Take 1 tablet (125 mcg total) by mouth daily before breakfast.     oxyCODONE-acetaminophen 5-325 MG per tablet  Commonly known as:  ROXICET  Take 1-2 tablets by mouth every 4 (four) hours as needed.        Discharge Assessment: Filed Vitals:   08/20/14 1115  BP: 100/64  Pulse: 66  Temp: 98.8 F (37.1 C)  Resp: 18   Skin clean, dry and intact without evidence of skin break down, no evidence of skin tears noted. IV catheter discontinued intact. Site without signs and symptoms of complications - no redness or edema noted at insertion site, patient denies c/o pain - only slight tenderness at site.  Dressing with slight pressure applied.  D/c Instructions-Education: Discharge instructions given to patient/family with verbalized understanding. D/c education completed with patient/family including follow up instructions, medication list, d/c activities limitations if indicated, with other d/c instructions as indicated by MD - patient able to verbalize understanding, all questions fully answered. Patient instructed to return to ED, call 911, or call MD for any changes in condition.  Patient escorted via [place], and D/C home via private auto.  Dayle Points, RN 08/20/2014 12:05 PM

## 2014-09-17 ENCOUNTER — Telehealth: Payer: Self-pay | Admitting: Endocrinology

## 2014-09-17 MED ORDER — LEVOTHYROXINE SODIUM 125 MCG PO TABS: 125.0000 ug | ORAL_TABLET | Freq: Every day | ORAL | Status: DC

## 2014-09-17 NOTE — Telephone Encounter (Signed)
Pt needs more refills on levothyroxine

## 2014-09-17 NOTE — Telephone Encounter (Signed)
Rx for levothyroxine sent to patients pharmacy.

## 2014-12-31 ENCOUNTER — Ambulatory Visit (INDEPENDENT_AMBULATORY_CARE_PROVIDER_SITE_OTHER): Payer: Commercial Managed Care - PPO | Admitting: Endocrinology

## 2014-12-31 ENCOUNTER — Encounter: Payer: Self-pay | Admitting: Endocrinology

## 2014-12-31 ENCOUNTER — Other Ambulatory Visit (INDEPENDENT_AMBULATORY_CARE_PROVIDER_SITE_OTHER): Payer: Commercial Managed Care - PPO

## 2014-12-31 VITALS — BP 112/80 | HR 68 | Temp 97.9°F | Ht 62.0 in | Wt 155.0 lb

## 2014-12-31 DIAGNOSIS — E890 Postprocedural hypothyroidism: Secondary | ICD-10-CM

## 2014-12-31 DIAGNOSIS — E039 Hypothyroidism, unspecified: Secondary | ICD-10-CM | POA: Insufficient documentation

## 2014-12-31 LAB — TSH: TSH: 3.66 u[IU]/mL (ref 0.35–4.50)

## 2014-12-31 NOTE — Progress Notes (Signed)
   Subjective:    Patient ID: [name], female    DOB: 1976-01-04, 39 y.o.   MRN: [id number]  HPI Pt returns for f/u of post-I-131 hypothyroidism, and multinodular goiter (dx'ed 2014; US showed 4.1 cm predominately solid nodule seen in the inferior portion of right thyroid lobe; nuc med scan showed the large nodule was hyperfunctioning; she had I-131 rx in early 2015; in Sept of 2015, she was rx'ed synthroid due to high TSH; f/u US in early 2016 was improved).  She had thyroidectomy in mid-2016.  pathol showed FIBROTIC ADENOMATOUS NODULE.  pt states she feels well in general.  She takes synthroid as rx'ed. Past Medical History  Diagnosis Date  . Thyroid nodule   . Hypothyroidism     Past Surgical History  Procedure Laterality Date  . Cesarean section  2008  . Total thyroidectomy  08/19/2014  . Tubal ligation  2008  . Thyroidectomy N/A 08/19/2014    Procedure: TOTAL THYROIDECTOMY;  Surgeon: [name], MD;  Location: [place];  Service: General;  Laterality: N/A;    Social History   Social History  . Marital Status: Married    Spouse Name: N/A  . Number of Children: N/A  . Years of Education: N/A   Occupational History  . Not on file.   Social History Main Topics  . Smoking status: Former Smoker -- 0.25 packs/day for 10 years    Types: Cigarettes    Quit date: 10/14/2012  . Smokeless tobacco: Never Used  . Alcohol Use: 3.0 oz/week    6 Standard drinks or equivalent per week     Comment: 08/19/2014 "might have a few drinks a couple times/month"  . Drug Use: No  . Sexual Activity: Yes    Birth Control/ Protection: None   Other Topics Concern  . Not on file   Social History Narrative    Current Outpatient Prescriptions on File Prior to Visit  Medication Sig Dispense Refill  . levothyroxine (SYNTHROID, LEVOTHROID) 125 MCG tablet Take 1 tablet (125 mcg total) by mouth daily before breakfast. 30 tablet 5   No current facility-administered medications on file prior to  visit.    Allergies  Allergen Reactions  . Allegra [Fexofenadine] Rash    Family History  Problem Relation Age of Onset  . Diabetes Father   . Heart disease Father   . Hypertension Father   . Cancer Maternal Grandmother     BP 112/80 mmHg  Pulse 68  Temp(Src) 97.9 F (36.6 C) (Oral)  Ht 5\' 2"  (1.575 m)  Wt 155 lb (70.308 kg)  BMI 28.34 kg/m2  SpO2 97%  Review of Systems Denies neck pain    Objective:   Physical Exam VITAL SIGNS:  See vs page GENERAL: no distress Neck: a healed scar is present.  i do not appreciate a nodule in the thyroid or elsewhere in the neck.    Lab Results  Component Value Date   TSH 3.66 12/31/2014   T3TOTAL 191.0 01/14/2013      Assessment & Plan:  Hypothyroidism, well replaced.   Patient is advised the following: Patient Instructions  blood tests are requested for you today.  We'll let you know about the results. I would be happy to see you back here as needed.

## 2014-12-31 NOTE — Patient Instructions (Addendum)
blood tests are requested for you today.  We'll let you know about the results. I would be happy to see you back here as needed.   

## 2015-01-13 IMAGING — US US THYROID BIOPSY
1 series · 11 of 11 positions shown · non-contrast
Comparison: US SOFT TISSUE HEAD/NECK dated 01/18/2013

CLINICAL DATA: Dominant thyroid nodule of right lobe extending into
the isthmus.

EXAM:
ULTRASOUND GUIDED NEEDLE ASPIRATE BIOPSY OF THE THYROID GLAND

[Series 1: us thyroid biopsy · 0.08mm/px · 11 acquisitions, 11 frames shown]
[im 1/11]
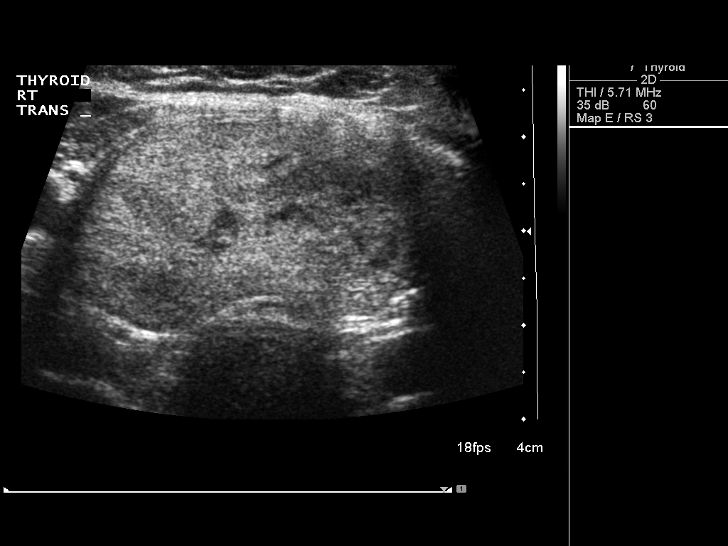
[im 2/11]
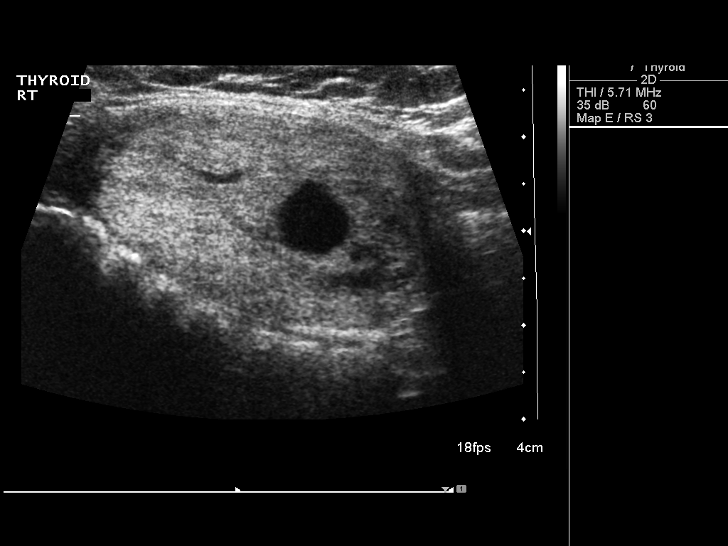
[im 3/11]
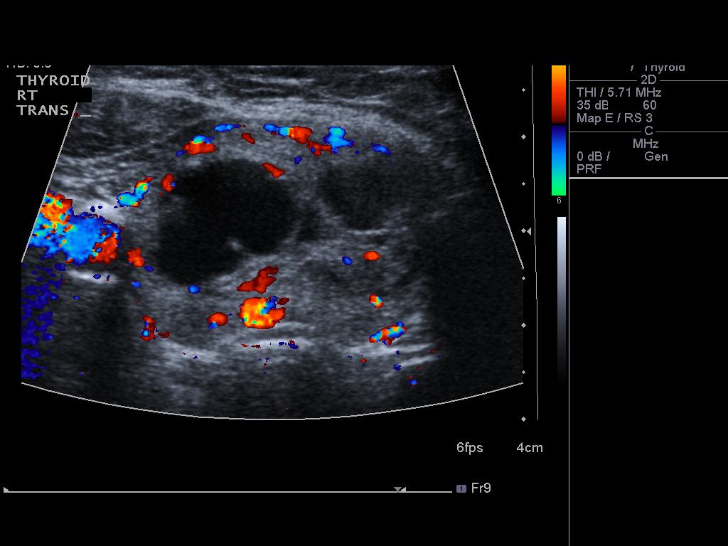
[im 4/11]
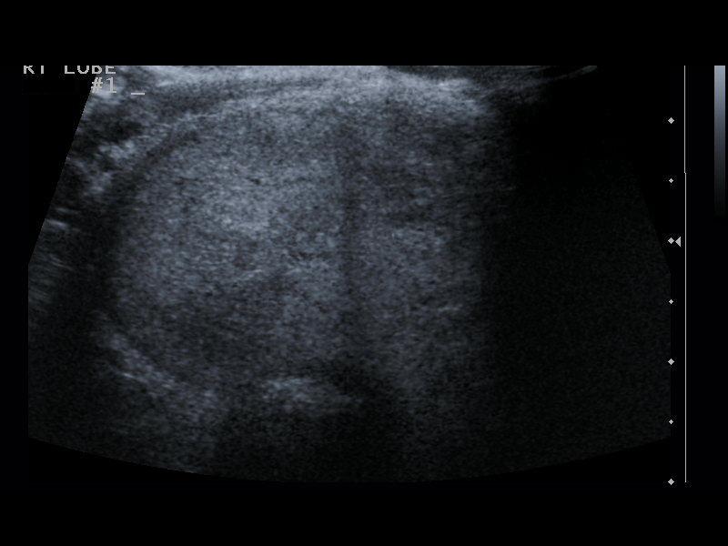
[im 5/11]
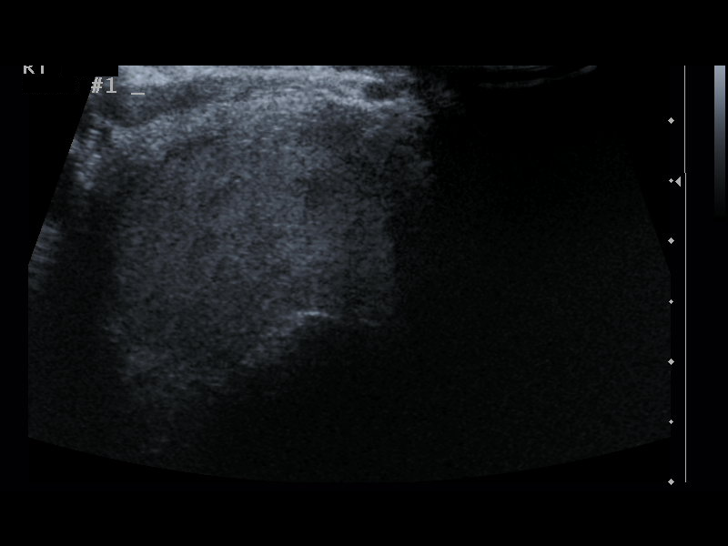
[im 6/11]
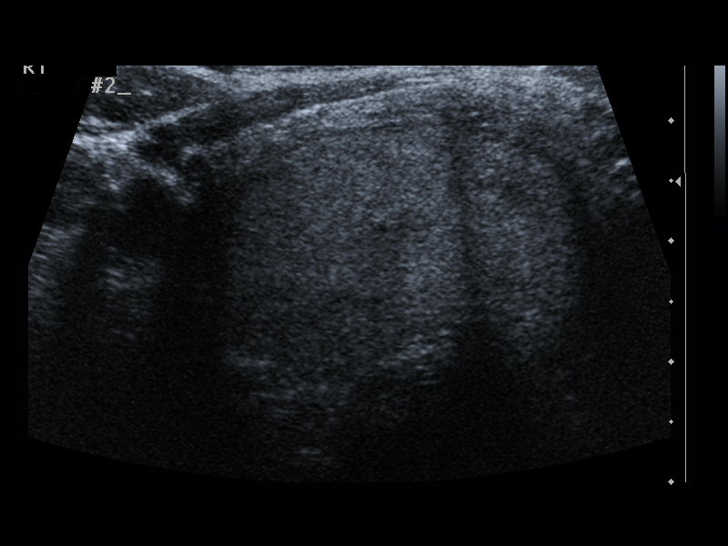
[im 7/11]
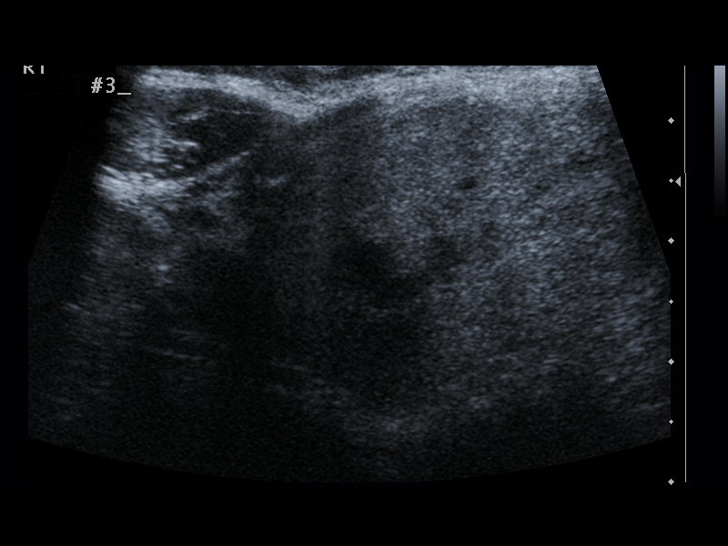
[im 8/11]
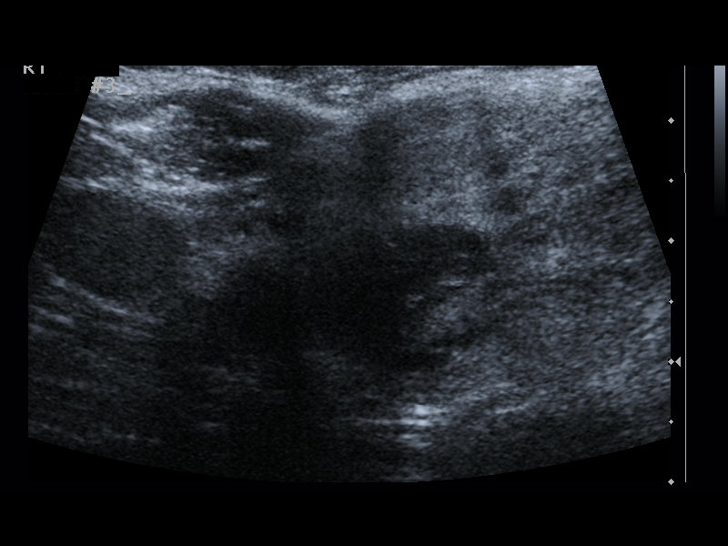
[im 9/11]
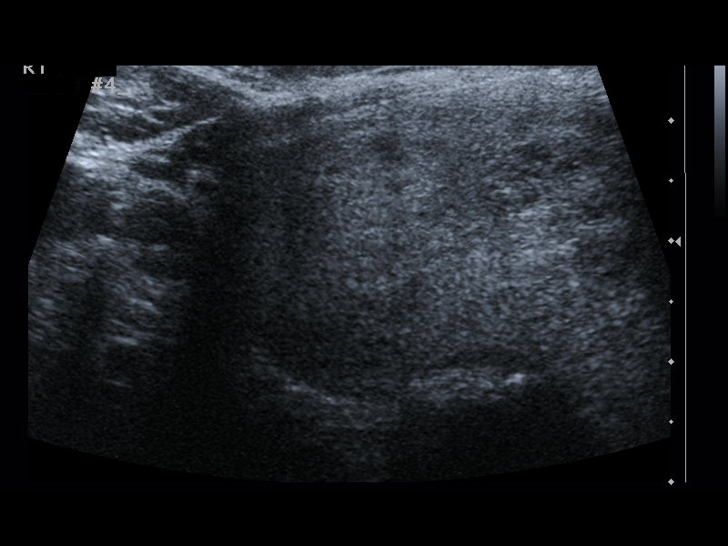
[im 10/11]
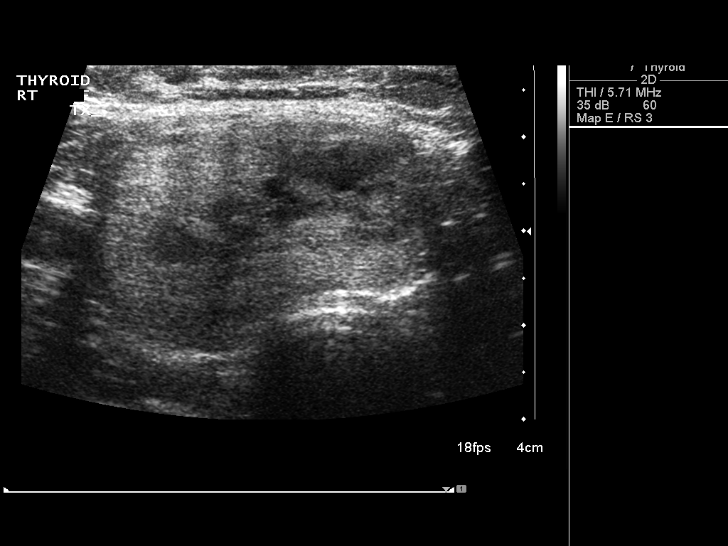
[im 11/11]
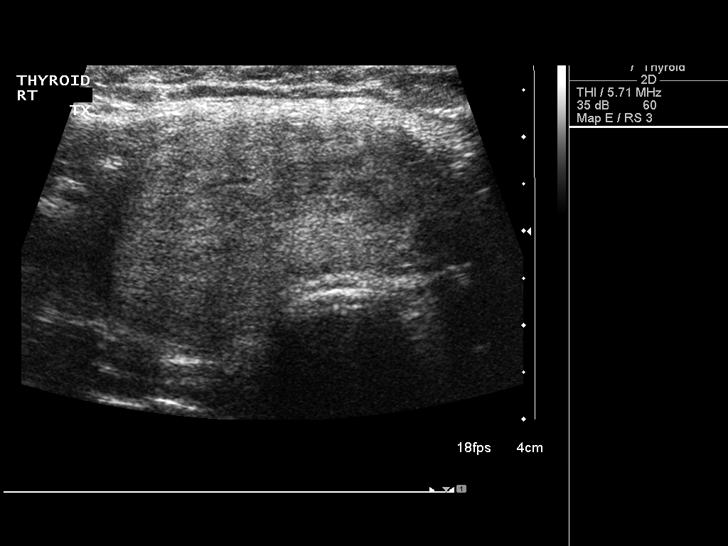

[11 of 11 positions shown; findings below may reference images not displayed]

FINDINGS: Needle aspirate samples were obtained in different portions of the
right thyroid nodule.
IMPRESSION: Ultrasound guided needle aspirate biopsy performed of the dominant
right thyroid nodule.

PROCEDURE:
Thyroid biopsy was thoroughly discussed with the patient and
questions were answered. The benefits, risks, alternatives, and
complications were also discussed. The patient understands and
wishes to proceed with the procedure. Written consent was obtained.

Ultrasound was performed to localize and mark an adequate site for
the biopsy. The patient was then prepped and draped in a normal
sterile fashion. Local anesthesia was provided with 1% lidocaine.
Using direct ultrasound guidance, 4 passes were made using 25 gauge
needles into the nodule within the right lobe of the thyroid.
Ultrasound was used to confirm needle placements on all occasions.
Specimens were sent to Pathology for analysis.

Complications:  None

## 2015-01-16 NOTE — H&P (Signed)
[name], Gill NO.:  [id number]  MEDICAL RECORD NO.:  [id number]  LOCATION:                                 FACILITY:  PHYSICIAN:  Darlyn Chamber, M.D.   DATE OF BIRTH:  [birth year]  DATE OF ADMISSION: DATE OF DISCHARGE:                             HISTORY & PHYSICAL   DATE OF SURGERY:  September 19th.  The surgery is going to be done at [facility] location.  HISTORY OF PRESENT ILLNESS:  The patient is a 39 year old gravida 1, para 85 female, presents for laparoscopic-assisted vaginal hysterectomy with removal of both fallopian tubes.  In relation to the present admission, the patient has a longstanding history of abnormal uterine bleeding with associated menorrhagia.  She is having approximately 2 periods per month.  She has 6 days of flow, 3 days being heavy, changing pads and tampons every hour, and significant dysmenorrhea.  She also has pain with intercourse.  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  Options were discussed with the patient.  This included cycle with birth control pills versus Mirena IUD versus ablation versus hysterectomy.  The patient now presents for hysterectomy and removal both fallopian tubes. Her hemoglobin noted at her visit on August 9th was 12.9.  She has had a bilateral tubal ligation.  ALLERGIES:  She is allergic to Allegra.  MEDICATIONS:  Levothyroxine 125 mcg a day.  PAST MEDICAL HISTORY:  Usual childhood diseases.  No significant sequelae.  Does have a history of hypothyroidism, migraine headaches, also has a history of colitis.  PREVIOUS SURGICAL HISTORY:  She had a primary cesarean section done for breech presentation, subsequently had laparoscopic bilateral tubal ligation.  SOCIAL HISTORY:  Reveals no tobacco or alcohol use.  FAMILY HISTORY:  Noncontributory.  REVIEW OF SYSTEMS:  Noncontributory.  PHYSICAL EXAMINATION:  VITAL SIGNS:  The patient is afebrile.   Stable vital signs. HEENT:  Normocephalic.  Pupils equal, round, reactive to light and accommodation.  Extraocular movements are intact.  Sclerae and conjunctivae are clear.  Oropharynx clear. NECK:  Without thyromegaly. BREASTS:  Not examined. LUNGS:  Clear. CARDIAC SYSTEM:  Regular rate without murmurs or gallops. ABDOMEN:  Benign.  No mass, organomegaly, or tenderness. PELVIC:  Normal external genitalia.  Vaginal mucosa is clear.  Cervix unremarkable.  Uterus normal size.  Adnexa unremarkable. EXTREMITIES:  Trace edema. NEUROLOGIC:  Grossly within normal limits.  IMPRESSION:  Menorrhagia and dysmenorrhea secondary to adenomyosis.  PLAN:  Again, options have been discussed.  The patient will proceed with laparoscopic-assisted vaginal hysterectomy with removal of both fallopian tubes.  The risks of surgery have been discussed including the risk of infection.  Risk of hemorrhage that could require transfusion with the risk of AIDS or hepatitis.  Risk of injury to adjacent organs such as bladder, bowel, ureters that could require further exploratory surgery.  Risk of deep venous thrombosis and pulmonary embolus.  The patient expressed understanding of the indications and potential risks.     Darlyn Chamber, M.D.     JSM/MEDQ  D:  01/16/2015  T:  01/16/2015  Job:  [id number]

## 2015-01-16 NOTE — H&P (Signed)
  Patient name  [name], [name] DICTATION# [id number] CSN# [id number]  Darlyn Chamber, MD 01/16/2015 8:21 AM

## 2015-01-20 ENCOUNTER — Encounter (HOSPITAL_BASED_OUTPATIENT_CLINIC_OR_DEPARTMENT_OTHER): Payer: Self-pay | Admitting: *Deleted

## 2015-01-20 ENCOUNTER — Other Ambulatory Visit: Payer: Self-pay | Admitting: Obstetrics and Gynecology

## 2015-01-20 NOTE — Progress Notes (Addendum)
NPO AFTER MN.  ARRIVE AT 0600.  NEEDS T & S ON ARRIVAL.  TO GET LAB WORK DONE AND  CXR TOMORROW 01-21-2015.  WILL TAKE AM MEDS W/ SIPS OF WATER.

## 2015-01-21 ENCOUNTER — Ambulatory Visit (HOSPITAL_COMMUNITY)
Admission: RE | Admit: 2015-01-21 | Discharge: 2015-01-21 | Disposition: A | Discharge status: Home / Self Care | Payer: Commercial Managed Care - PPO | Source: Ambulatory Visit | Attending: Anesthesiology | Admitting: Anesthesiology

## 2015-01-21 DIAGNOSIS — Z01818 Encounter for other preprocedural examination: Secondary | ICD-10-CM | POA: Insufficient documentation

## 2015-01-21 DIAGNOSIS — R05 Cough: Secondary | ICD-10-CM | POA: Insufficient documentation

## 2015-01-21 DIAGNOSIS — N920 Excessive and frequent menstruation with regular cycle: Secondary | ICD-10-CM | POA: Diagnosis not present

## 2015-01-21 DIAGNOSIS — R0602 Shortness of breath: Secondary | ICD-10-CM | POA: Insufficient documentation

## 2015-01-21 DIAGNOSIS — N939 Abnormal uterine and vaginal bleeding, unspecified: Secondary | ICD-10-CM | POA: Diagnosis present

## 2015-01-21 DIAGNOSIS — E039 Hypothyroidism, unspecified: Secondary | ICD-10-CM | POA: Diagnosis not present

## 2015-01-21 DIAGNOSIS — N946 Dysmenorrhea, unspecified: Secondary | ICD-10-CM | POA: Diagnosis not present

## 2015-01-21 DIAGNOSIS — N800 Endometriosis of uterus: Secondary | ICD-10-CM | POA: Diagnosis not present

## 2015-01-21 LAB — HCG, SERUM, QUALITATIVE: PREG SERUM: NEGATIVE

## 2015-01-21 LAB — CBC
HCT: 42.2 % (ref 36.0–46.0)
Hemoglobin: 14.4 g/dL (ref 12.0–15.0)
MCH: 31.2 pg (ref 26.0–34.0)
MCHC: 34.1 g/dL (ref 30.0–36.0)
MCV: 91.5 fL (ref 78.0–100.0)
PLATELETS: 229 10*3/uL (ref 150–400)
RBC: 4.61 MIL/uL (ref 3.87–5.11)
RDW: 12.8 % (ref 11.5–15.5)
WBC: 9.9 10*3/uL (ref 4.0–10.5)

## 2015-01-21 LAB — CYTOLOGY - PAP

## 2015-01-26 ENCOUNTER — Encounter (HOSPITAL_BASED_OUTPATIENT_CLINIC_OR_DEPARTMENT_OTHER): Payer: Self-pay | Admitting: Anesthesiology

## 2015-01-26 ENCOUNTER — Ambulatory Visit (HOSPITAL_BASED_OUTPATIENT_CLINIC_OR_DEPARTMENT_OTHER): Payer: Commercial Managed Care - PPO | Admitting: Anesthesiology

## 2015-01-26 ENCOUNTER — Encounter (HOSPITAL_COMMUNITY)
Admission: RE | Disposition: A | Discharge status: Home / Self Care | Payer: Self-pay | Source: Ambulatory Visit | Attending: Obstetrics and Gynecology

## 2015-01-26 ENCOUNTER — Observation Stay (HOSPITAL_BASED_OUTPATIENT_CLINIC_OR_DEPARTMENT_OTHER)
Admission: RE | Admit: 2015-01-26 | Discharge: 2015-01-27 | Disposition: A | Discharge status: Home / Self Care | Payer: Commercial Managed Care - PPO | Source: Ambulatory Visit | Attending: Obstetrics and Gynecology | Admitting: Obstetrics and Gynecology

## 2015-01-26 DIAGNOSIS — Z01818 Encounter for other preprocedural examination: Secondary | ICD-10-CM

## 2015-01-26 DIAGNOSIS — N800 Endometriosis of uterus: Principal | ICD-10-CM | POA: Insufficient documentation

## 2015-01-26 DIAGNOSIS — E039 Hypothyroidism, unspecified: Secondary | ICD-10-CM | POA: Insufficient documentation

## 2015-01-26 DIAGNOSIS — N920 Excessive and frequent menstruation with regular cycle: Secondary | ICD-10-CM | POA: Insufficient documentation

## 2015-01-26 DIAGNOSIS — N946 Dysmenorrhea, unspecified: Secondary | ICD-10-CM | POA: Insufficient documentation

## 2015-01-26 DIAGNOSIS — Z90710 Acquired absence of both cervix and uterus: Secondary | ICD-10-CM | POA: Diagnosis present

## 2015-01-26 HISTORY — DX: Dysmenorrhea, unspecified: N94.6

## 2015-01-26 HISTORY — DX: Excessive and frequent menstruation with regular cycle: N92.0

## 2015-01-26 HISTORY — DX: Endometriosis of the uterus, unspecified: N80.00

## 2015-01-26 HISTORY — DX: Adenomyosis of the uterus: N80.03

## 2015-01-26 HISTORY — DX: Endometriosis of uterus: N80.0

## 2015-01-26 HISTORY — DX: Personal history of other endocrine, nutritional and metabolic disease: Z86.39

## 2015-01-26 HISTORY — DX: Postprocedural hypothyroidism: E89.0

## 2015-01-26 LAB — ABO/RH: ABO/RH(D): A NEG

## 2015-01-26 LAB — TYPE AND SCREEN
ABO/RH(D): A NEG
ANTIBODY SCREEN: NEGATIVE

## 2015-01-26 SURGERY — HYSTERECTOMY, VAGINAL, LAPAROSCOPY-ASSISTED, WITH SALPINGECTOMY
Anesthesia: General | Laterality: Bilateral

## 2015-01-26 MED ORDER — BUPIVACAINE HCL 0.25 % IJ SOLN
INTRAMUSCULAR | Status: DC | PRN
  Administered 2015-01-26: 8 mL

## 2015-01-26 MED ORDER — ONDANSETRON HCL 4 MG/2ML IJ SOLN
INTRAMUSCULAR | Status: DC | PRN
  Administered 2015-01-26: 4 mg via INTRAVENOUS

## 2015-01-26 MED ORDER — ACETAMINOPHEN 10 MG/ML IV SOLN
INTRAVENOUS | Status: DC | PRN
  Administered 2015-01-26: 1000 mg via INTRAVENOUS

## 2015-01-26 MED ORDER — SODIUM CHLORIDE 0.9 % IJ SOLN: 9.0000 mL | INTRAMUSCULAR | Status: DC | PRN

## 2015-01-26 MED ORDER — ONDANSETRON HCL 4 MG/2ML IJ SOLN: 4.0000 mg | Freq: Four times a day (QID) | INTRAMUSCULAR | Status: DC | PRN

## 2015-01-26 MED ORDER — PROMETHAZINE HCL 25 MG/ML IJ SOLN
6.2500 mg | INTRAMUSCULAR | Status: DC | PRN
  Filled 2015-01-26: qty 1

## 2015-01-26 MED ORDER — FENTANYL CITRATE (PF) 100 MCG/2ML IJ SOLN
INTRAMUSCULAR | Status: DC | PRN
  Administered 2015-01-26: 25 ug via INTRAVENOUS
  Administered 2015-01-26 (×2): 50 ug via INTRAVENOUS
  Administered 2015-01-26: 25 ug via INTRAVENOUS
  Administered 2015-01-26: 50 ug via INTRAVENOUS

## 2015-01-26 MED ORDER — HYDROMORPHONE HCL 1 MG/ML IJ SOLN
INTRAMUSCULAR | Status: AC
  Filled 2015-01-26: qty 1

## 2015-01-26 MED ORDER — MIDAZOLAM HCL 2 MG/2ML IJ SOLN
INTRAMUSCULAR | Status: AC
  Filled 2015-01-26: qty 2

## 2015-01-26 MED ORDER — MENTHOL 3 MG MT LOZG
1.0000 | LOZENGE | OROMUCOSAL | Status: DC | PRN
  Filled 2015-01-26: qty 9

## 2015-01-26 MED ORDER — OXYCODONE-ACETAMINOPHEN 5-325 MG PO TABS
1.0000 | ORAL_TABLET | ORAL | Status: DC | PRN
  Administered 2015-01-26 – 2015-01-27 (×2): 2 via ORAL
  Filled 2015-01-26 (×2): qty 2

## 2015-01-26 MED ORDER — DEXAMETHASONE SODIUM PHOSPHATE 4 MG/ML IJ SOLN
INTRAMUSCULAR | Status: DC | PRN
  Administered 2015-01-26: 10 mg via INTRAVENOUS

## 2015-01-26 MED ORDER — FENTANYL CITRATE (PF) 100 MCG/2ML IJ SOLN
25.0000 ug | INTRAMUSCULAR | Status: DC | PRN
  Filled 2015-01-26: qty 1

## 2015-01-26 MED ORDER — ACETAMINOPHEN 325 MG PO TABS: 650.0000 mg | ORAL_TABLET | ORAL | Status: DC | PRN

## 2015-01-26 MED ORDER — NALOXONE HCL 0.4 MG/ML IJ SOLN: 0.4000 mg | INTRAMUSCULAR | Status: DC | PRN

## 2015-01-26 MED ORDER — LIDOCAINE HCL 4 % MT SOLN
OROMUCOSAL | Status: DC | PRN
  Administered 2015-01-26: 2 mL via TOPICAL

## 2015-01-26 MED ORDER — LACTATED RINGERS IV SOLN: INTRAVENOUS | Status: DC

## 2015-01-26 MED ORDER — ONDANSETRON HCL 4 MG PO TABS: 4.0000 mg | ORAL_TABLET | Freq: Four times a day (QID) | ORAL | Status: DC | PRN

## 2015-01-26 MED ORDER — ROCURONIUM BROMIDE 100 MG/10ML IV SOLN
INTRAVENOUS | Status: DC | PRN
  Administered 2015-01-26: 35 mg via INTRAVENOUS
  Administered 2015-01-26: 5 mg via INTRAVENOUS

## 2015-01-26 MED ORDER — HYDROMORPHONE HCL 1 MG/ML IJ SOLN
0.5000 mg | INTRAMUSCULAR | Status: AC | PRN
  Administered 2015-01-26: 0.5 mg via INTRAVENOUS
  Administered 2015-01-26: 0.25 mg via INTRAVENOUS
  Administered 2015-01-26: 0.5 mg via INTRAVENOUS
  Administered 2015-01-26: 0.25 mg via INTRAVENOUS
  Filled 2015-01-26: qty 1

## 2015-01-26 MED ORDER — GLYCOPYRROLATE 0.2 MG/ML IJ SOLN
INTRAMUSCULAR | Status: DC | PRN
  Administered 2015-01-26: 0.4 mg via INTRAVENOUS

## 2015-01-26 MED ORDER — CEFAZOLIN SODIUM-DEXTROSE 2-3 GM-% IV SOLR
INTRAVENOUS | Status: AC
Start: 2015-01-26 — End: 2015-01-26
  Filled 2015-01-26: qty 50

## 2015-01-26 MED ORDER — HYDROMORPHONE 0.3 MG/ML IV SOLN
INTRAVENOUS | Status: DC
  Administered 2015-01-26: 2.59 mg via INTRAVENOUS
  Administered 2015-01-26: 0.599 mg via INTRAVENOUS
  Administered 2015-01-26: 13:00:00 via INTRAVENOUS
  Administered 2015-01-27: 2.79 mg via INTRAVENOUS
  Administered 2015-01-27: 0.999 mg via INTRAVENOUS
  Filled 2015-01-26 (×2): qty 25

## 2015-01-26 MED ORDER — LIDOCAINE HCL (CARDIAC) 20 MG/ML IV SOLN
INTRAVENOUS | Status: DC | PRN
  Administered 2015-01-26: 40 mg via INTRAVENOUS

## 2015-01-26 MED ORDER — LACTATED RINGERS IR SOLN
Status: DC | PRN
  Administered 2015-01-26: 3000 mL

## 2015-01-26 MED ORDER — LEVOTHYROXINE SODIUM 100 MCG PO TABS
125.0000 ug | ORAL_TABLET | Freq: Every day | ORAL | Status: DC
  Administered 2015-01-27: 125 ug via ORAL
  Filled 2015-01-26 (×3): qty 1

## 2015-01-26 MED ORDER — NEOSTIGMINE METHYLSULFATE 10 MG/10ML IV SOLN
INTRAVENOUS | Status: DC | PRN
  Administered 2015-01-26: 3 mg via INTRAVENOUS

## 2015-01-26 MED ORDER — HYDROMORPHONE HCL 1 MG/ML IJ SOLN
INTRAMUSCULAR | Status: AC
Start: 2015-01-26 — End: 2015-01-26
  Filled 2015-01-26: qty 1

## 2015-01-26 MED ORDER — PROPOFOL 10 MG/ML IV BOLUS
INTRAVENOUS | Status: DC | PRN
  Administered 2015-01-26 (×2): 50 mg via INTRAVENOUS
  Administered 2015-01-26: 200 mg via INTRAVENOUS
  Administered 2015-01-26 (×2): 50 mg via INTRAVENOUS

## 2015-01-26 MED ORDER — DIPHENHYDRAMINE HCL 50 MG/ML IJ SOLN: 12.5000 mg | Freq: Four times a day (QID) | INTRAMUSCULAR | Status: DC | PRN

## 2015-01-26 MED ORDER — CEFAZOLIN SODIUM-DEXTROSE 2-3 GM-% IV SOLR
2.0000 g | INTRAVENOUS | Status: AC
  Administered 2015-01-26: 2 g via INTRAVENOUS
  Filled 2015-01-26: qty 50

## 2015-01-26 MED ORDER — LACTATED RINGERS IV SOLN
INTRAVENOUS | Status: DC
  Administered 2015-01-26: 07:00:00 via INTRAVENOUS
  Filled 2015-01-26: qty 1000

## 2015-01-26 MED ORDER — MIDAZOLAM HCL 5 MG/5ML IJ SOLN
INTRAMUSCULAR | Status: DC | PRN
  Administered 2015-01-26: 2 mg via INTRAVENOUS

## 2015-01-26 MED ORDER — FENTANYL CITRATE (PF) 100 MCG/2ML IJ SOLN
INTRAMUSCULAR | Status: AC
  Filled 2015-01-26: qty 4

## 2015-01-26 MED ORDER — DIPHENHYDRAMINE HCL 12.5 MG/5ML PO ELIX: 12.5000 mg | ORAL_SOLUTION | Freq: Four times a day (QID) | ORAL | Status: DC | PRN

## 2015-01-26 SURGICAL SUPPLY — 81 items
APPLICATOR COTTON TIP 6IN STRL (MISCELLANEOUS) ×2 IMPLANT
BAG URINE DRAINAGE (UROLOGICAL SUPPLIES) ×2 IMPLANT
BANDAGE ADH SHEER 1  50/CT (GAUZE/BANDAGES/DRESSINGS) ×2 IMPLANT
BLADE CLIPPER SURG (BLADE) IMPLANT
BLADE SURG 10 STRL SS (BLADE) ×2 IMPLANT
BLADE SURG 11 STRL SS (BLADE) ×2 IMPLANT
CANISTER SUCTION 2500CC (MISCELLANEOUS) ×6 IMPLANT
CATH FOLEY 2WAY SLVR  5CC 14FR (CATHETERS) ×1
CATH FOLEY 2WAY SLVR 5CC 14FR (CATHETERS) ×1 IMPLANT
CATH ROBINSON RED A/P 16FR (CATHETERS) ×2 IMPLANT
COVER BACK TABLE 60X90IN (DRAPES) ×4 IMPLANT
COVER MAYO STAND STRL (DRAPES) ×4 IMPLANT
DRAPE LG THREE QUARTER DISP (DRAPES) ×2 IMPLANT
DRAPE UNDERBUTTOCKS STRL (DRAPE) ×2 IMPLANT
DRSG TEGADERM 2-3/8X2-3/4 SM (GAUZE/BANDAGES/DRESSINGS) ×6 IMPLANT
DRSG TEGADERM 4X4.75 (GAUZE/BANDAGES/DRESSINGS) ×4 IMPLANT
ELECT REM PT RETURN 9FT ADLT (ELECTROSURGICAL) ×2
ELECTRODE REM PT RTRN 9FT ADLT (ELECTROSURGICAL) ×1 IMPLANT
FILTER SMOKE EVAC LAPAROSHD (FILTER) IMPLANT
GLOVE BIO SURGEON STRL SZ 6.5 (GLOVE) ×4 IMPLANT
GLOVE BIO SURGEON STRL SZ7 (GLOVE) ×8 IMPLANT
GLOVE BIO SURGEON STRL SZ7.5 (GLOVE) ×2 IMPLANT
GLOVE BIOGEL PI IND STRL 6.5 (GLOVE) ×3 IMPLANT
GLOVE BIOGEL PI IND STRL 7.5 (GLOVE) ×1 IMPLANT
GLOVE BIOGEL PI INDICATOR 6.5 (GLOVE) ×3
GLOVE BIOGEL PI INDICATOR 7.5 (GLOVE) ×1
GOWN STRL REUS W/ TWL LRG LVL3 (GOWN DISPOSABLE) ×1 IMPLANT
GOWN STRL REUS W/ TWL XL LVL3 (GOWN DISPOSABLE) ×1 IMPLANT
GOWN STRL REUS W/TWL LRG LVL3 (GOWN DISPOSABLE) ×1
GOWN STRL REUS W/TWL XL LVL3 (GOWN DISPOSABLE) ×1
HOLDER FOLEY CATH W/STRAP (MISCELLANEOUS) ×2 IMPLANT
LIQUID BAND (GAUZE/BANDAGES/DRESSINGS) ×2 IMPLANT
MANIFOLD NEPTUNE II (INSTRUMENTS) IMPLANT
NEEDLE HYPO 22GX1.5 SAFETY (NEEDLE) ×2 IMPLANT
NEEDLE INSUFFLATION 14GA 120MM (NEEDLE) IMPLANT
NEEDLE INSUFFLATION 14GA 150MM (NEEDLE) IMPLANT
NS IRRIG 500ML POUR BTL (IV SOLUTION) ×2 IMPLANT
PACK BASIN DAY SURGERY FS (CUSTOM PROCEDURE TRAY) ×2 IMPLANT
PAD OB MATERNITY 4.3X12.25 (PERSONAL CARE ITEMS) ×2 IMPLANT
PAD PREP 24X48 CUFFED NSTRL (MISCELLANEOUS) ×2 IMPLANT
PADDING ION DISPOSABLE (MISCELLANEOUS) ×2 IMPLANT
PENCIL BUTTON HOLSTER BLD 10FT (ELECTRODE) ×2 IMPLANT
POUCH SPECIMEN RETRIEVAL 10MM (ENDOMECHANICALS) IMPLANT
SCISSORS LAP 5X35 DISP (ENDOMECHANICALS) IMPLANT
SEALER TISSUE G2 CVD JAW 45CM (ENDOMECHANICALS) ×2 IMPLANT
SET IRRIG TUBING LAPAROSCOPIC (IRRIGATION / IRRIGATOR) ×2 IMPLANT
SET IRRIG Y TYPE TUR BLADDER L (SET/KITS/TRAYS/PACK) ×2 IMPLANT
SHEET LAVH (DRAPES) ×2 IMPLANT
SOLUTION ANTI FOG 6CC (MISCELLANEOUS) ×2 IMPLANT
SOLUTION ELECTROLUBE (MISCELLANEOUS) IMPLANT
SPONGE GAUZE 2X2 8PLY STRL LF (GAUZE/BANDAGES/DRESSINGS) ×2 IMPLANT
SPONGE LAP 4X18 X RAY DECT (DISPOSABLE) ×2 IMPLANT
STRIP CLOSURE SKIN 1/4X4 (GAUZE/BANDAGES/DRESSINGS) IMPLANT
SUT MNCRL AB 4-0 PS2 18 (SUTURE) ×2 IMPLANT
SUT MON AB 2-0 CT1 36 (SUTURE) ×2 IMPLANT
SUT VIC AB 0 CT1 18XCR BRD8 (SUTURE) ×3 IMPLANT
SUT VIC AB 0 CT1 36 (SUTURE) ×4 IMPLANT
SUT VIC AB 0 CT1 8-18 (SUTURE) ×3
SUT VIC AB 2-0 CT1 (SUTURE) IMPLANT
SUT VIC AB 2-0 SH 27 (SUTURE)
SUT VIC AB 2-0 SH 27XBRD (SUTURE) IMPLANT
SUT VIC AB 3-0 SH 27 (SUTURE)
SUT VIC AB 3-0 SH 27X BRD (SUTURE) IMPLANT
SUT VICRYL 0 UR6 27IN ABS (SUTURE) ×2 IMPLANT
SUT VICRYL 1 TIES 12X18 (SUTURE) ×2 IMPLANT
SUT VICRYL 4-0 PS2 18IN ABS (SUTURE) ×4 IMPLANT
SYR BULB IRRIGATION 50ML (SYRINGE) ×2 IMPLANT
SYR CONTROL 10ML LL (SYRINGE) ×2 IMPLANT
SYRINGE 10CC LL (SYRINGE) ×2 IMPLANT
TOWEL OR 17X24 6PK STRL BLUE (TOWEL DISPOSABLE) ×4 IMPLANT
TRAY DSU PREP LF (CUSTOM PROCEDURE TRAY) ×2 IMPLANT
TROCAR BALLN 12MMX100 BLUNT (TROCAR) ×2 IMPLANT
TROCAR OPTI TIP 5M 100M (ENDOMECHANICALS) ×4 IMPLANT
TROCAR XCEL 12X100 BLDLESS (ENDOMECHANICALS) ×2 IMPLANT
TROCAR XCEL DIL TIP R 11M (ENDOMECHANICALS) IMPLANT
TUBE CONNECTING 12X1/4 (SUCTIONS) ×4 IMPLANT
TUBING INSUFFLATION 10FT LAP (TUBING) ×2 IMPLANT
VACUUM HOSE/TUBING 7/8INX6FT (MISCELLANEOUS) IMPLANT
WARMER LAPAROSCOPE (MISCELLANEOUS) ×2 IMPLANT
WATER STERILE IRR 500ML POUR (IV SOLUTION) ×2 IMPLANT
YANKAUER SUCT BULB TIP NO VENT (SUCTIONS) ×2 IMPLANT

## 2015-01-26 NOTE — Anesthesia Postprocedure Evaluation (Signed)
  Anesthesia Post-op Note  Patient: [name]  Procedure(s) Performed: Procedure(s) (LRB): LAPAROSCOPIC ASSISTED VAGINAL HYSTERECTOMY WITH SALPINGECTOMY (Bilateral)  Patient Location: PACU  Anesthesia Type: General  Level of Consciousness: awake and alert   Airway and Oxygen Therapy: Patient Spontanous Breathing  Post-op Pain: mild  Post-op Assessment: Post-op Vital signs reviewed, Patient's Cardiovascular Status Stable, Respiratory Function Stable, Patent Airway and No signs of Nausea or vomiting  Last Vitals:  Filed Vitals:   01/26/15 1127  BP: 118/70  Pulse: 53  Temp: 37.1 C  Resp: 16    Post-op Vital Signs: stable   Complications: No apparent anesthesia complications

## 2015-01-26 NOTE — Transfer of Care (Signed)
Immediate Anesthesia Transfer of Care Note  Patient: [name]  Procedure(s) Performed: Procedure(s) with comments: LAPAROSCOPIC ASSISTED VAGINAL HYSTERECTOMY WITH SALPINGECTOMY (Bilateral) - NEED BED  Patient Location: PACU  Anesthesia Type:General  Level of Consciousness: awake, alert , oriented and patient cooperative  Airway & Oxygen Therapy: Patient Spontanous Breathing and Patient connected to nasal cannula oxygen  Post-op Assessment: Report given to RN and Post -op Vital signs reviewed and stable  Post vital signs: Reviewed and stable  Last Vitals:  Filed Vitals:   01/26/15 0613  BP: 110/86  Pulse: 99  Temp: 36.7 C  Resp: 16    Complications: No apparent anesthesia complications

## 2015-01-26 NOTE — Anesthesia Procedure Notes (Signed)
Procedure Name: Intubation Date/Time: 01/26/2015 7:21 AM Performed by: Wanita Chamberlain Pre-anesthesia Checklist: Patient identified, Timeout performed, Emergency Drugs available, Suction available and Patient being monitored Patient Re-evaluated:Patient Re-evaluated prior to inductionOxygen Delivery Method: Circle system utilized Preoxygenation: Pre-oxygenation with 100% oxygen Intubation Type: IV induction Ventilation: Mask ventilation without difficulty Laryngoscope Size: Mac and 3 Grade View: Grade I Tube type: Oral Tube size: 7.0 mm Number of attempts: 1 Airway Equipment and Method: Stylet,  Bite block and LTA kit utilized Placement Confirmation: ETT inserted through vocal cords under direct vision,  breath sounds checked- equal and bilateral and positive ETCO2 Secured at: 21 cm Tube secured with: Tape Dental Injury: Teeth and Oropharynx as per pre-operative assessment

## 2015-01-26 NOTE — Anesthesia Preprocedure Evaluation (Addendum)
Anesthesia Evaluation  Patient identified by MRN, date of birth, ID band Patient awake    Reviewed: Allergy & Precautions, NPO status , Patient's Chart, lab work & pertinent test results  Airway Mallampati: II  TM Distance: >3 FB Neck ROM: Full    Dental no notable dental hx.    Pulmonary shortness of breath, former smoker,  No shortness of breath since thyroidectomy in April 2016   Pulmonary exam normal breath sounds clear to auscultation       Cardiovascular negative cardio ROS Normal cardiovascular exam Rhythm:Regular Rate:Normal     Neuro/Psych negative neurological ROS  negative psych ROS   GI/Hepatic negative GI ROS, Neg liver ROS,   Endo/Other  Hypothyroidism   Renal/GU negative Renal ROS  negative genitourinary   Musculoskeletal negative musculoskeletal ROS (+)   Abdominal   Peds negative pediatric ROS (+)  Hematology negative hematology ROS (+)   Anesthesia Other Findings   Reproductive/Obstetrics negative OB ROS                           Anesthesia Physical Anesthesia Plan  ASA: II  Anesthesia Plan: General   Post-op Pain Management:    Induction: Intravenous  Airway Management Planned: Oral ETT  Additional Equipment:   Intra-op Plan:   Post-operative Plan: Extubation in OR  Informed Consent: I have reviewed the patients History and Physical, chart, labs and discussed the procedure including the risks, benefits and alternatives for the proposed anesthesia with the patient or authorized representative who has indicated his/her understanding and acceptance.   Dental advisory given  Plan Discussed with: CRNA  Anesthesia Plan Comments:         Anesthesia Quick Evaluation

## 2015-01-26 NOTE — H&P (Signed)
  History and physical exam unchanged 

## 2015-01-26 NOTE — Brief Op Note (Signed)
01/26/2015  9:12 AM  PATIENT:  [name]  39 y.o. female  PRE-OPERATIVE DIAGNOSIS:  AUB, PELVIC PAIN, ADENOMYOSIS  POST-OPERATIVE DIAGNOSIS:  AUB, PELVIC PAIN, ADENOMYOSIS  PROCEDURE:  Procedure(s) with comments: LAPAROSCOPIC ASSISTED VAGINAL HYSTERECTOMY WITH SALPINGECTOMY (Bilateral) - NEED BED  SURGEON:  Surgeon(s) and Role:    * Arvella Nigh, MD - Primary    * W Evette Cristal, MD - Assisting  PHYSICIAN ASSISTANT:   ASSISTANTS: neal   ANESTHESIA:   general  EBL:  Total I/O In: 500 [I.V.:500] Out: 325 [Urine:50; Blood:275]  BLOOD ADMINISTERED:none  DRAINS: Urinary Catheter (Foley)   LOCAL MEDICATIONS USED:  MARCAINE     SPECIMEN:  Source of Specimen:  uterus and distll end of each fallopian tubes  DISPOSITION OF SPECIMEN:  PATHOLOGY  COUNTS:  YES  TOURNIQUET:  * No tourniquets in log *  DICTATION: .Other Dictation: Dictation Number [id number]  PLAN OF CARE: Admit for overnight observation  PATIENT DISPOSITION:  PACU - hemodynamically stable.   Delay start of Pharmacological VTE agent (>24hrs) due to surgical blood loss or risk of bleeding: no

## 2015-01-26 NOTE — Progress Notes (Signed)
Patient ID: [name], female   DOB: 29-Oct-1975, 39 y.o.   MRN: [id number] No nausea Good uo Inc clear Minimal bleeding

## 2015-01-27 DIAGNOSIS — N800 Endometriosis of uterus: Secondary | ICD-10-CM | POA: Diagnosis not present

## 2015-01-27 LAB — CBC
HCT: 33.2 % — ABNORMAL LOW (ref 36.0–46.0)
Hemoglobin: 11.5 g/dL — ABNORMAL LOW (ref 12.0–15.0)
MCH: 31.6 pg (ref 26.0–34.0)
MCHC: 34.6 g/dL (ref 30.0–36.0)
MCV: 91.2 fL (ref 78.0–100.0)
PLATELETS: 224 10*3/uL (ref 150–400)
RBC: 3.64 MIL/uL — ABNORMAL LOW (ref 3.87–5.11)
RDW: 12.7 % (ref 11.5–15.5)
WBC: 16.4 10*3/uL — AB (ref 4.0–10.5)

## 2015-01-27 MED ORDER — OXYCODONE-ACETAMINOPHEN 7.5-325 MG PO TABS: 1.0000 | ORAL_TABLET | ORAL | Status: DC | PRN

## 2015-01-27 MED ORDER — MENTHOL 3 MG MT LOZG: 1.0000 | LOZENGE | OROMUCOSAL | Status: DC | PRN

## 2015-01-27 NOTE — Discharge Summary (Signed)
  Patient name [name], millar DICTATION# [id number] CSN# [id number]  Darlyn Chamber, MD 01/27/2015 7:19 AM

## 2015-01-27 NOTE — Progress Notes (Signed)
1 Day Post-Op Procedure(s) (LRB): LAPAROSCOPIC ASSISTED VAGINAL HYSTERECTOMY WITH SALPINGECTOMY (Bilateral)  Subjective: Patient reports tolerating PO and + flatus.    Objective: I have reviewed patient's vital signs and labs.  General: alert GI: soft, non-tender; bowel sounds normal; no masses,  no organomegaly and incision: clean Vaginal Bleeding: minimal  Assessment: s/p Procedure(s) with comments: LAPAROSCOPIC ASSISTED VAGINAL HYSTERECTOMY WITH SALPINGECTOMY (Bilateral) - NEED BED: stable  Plan: Discharge home     [facility] S 01/27/2015, 7:18 AM

## 2015-01-27 NOTE — Progress Notes (Signed)
All DC instructions and prescriptions reviewed with the pt and all questions and concerns addressed. Pt is alert and oriented times 4, VSS, pain meds given orally to controll her pain until she can have prescription filled, incisions sites look unremarkable and all other skin is intact. Pt waiting on her husband for ride home. Will continue to monitor until she has left the unit.

## 2015-01-27 NOTE — Discharge Summary (Signed)
NAMELACRESHA, [name] NO.:  [id number]  MEDICAL RECORD NO.:  [id number]  LOCATION:  9518                         FACILITY:  [facility]  PHYSICIAN:  Darlyn Chamber, M.D.   DATE OF BIRTH:  03-07-1976  DATE OF ADMISSION:  01/26/2015 DATE OF DISCHARGE:  01/27/2015                              DISCHARGE SUMMARY   ADMITTING DIAGNOSES:  Abnormal uterine bleeding and pelvic pain secondary to adenomyosis.  DISCHARGE DIAGNOSES:  Abnormal uterine bleeding and pelvic pain secondary to adenomyosis.  PROCEDURES:  Laparoscopic-assisted vaginal hysterectomy with removal of both fallopian tubes.  For complete history and physical, please see dictated note.  COURSE IN THE HOSPITAL:  The patient underwent above-noted surgery. Postoperatively, she did extremely well.  Her postop hemoglobin was 11.5.  She was discharged home on her first postoperative day.  At that time, she was afebrile with stable vital signs.  She was tolerating her diet.  She had no active vaginal bleeding.  All incisions were clear. Foley was still in place.  In terms of complications, none were encountered during her stay in the hospital.  The patient discharged home in a stable condition.  DISPOSITION:  The patient is to avoid heavy lifting, vaginal entrance, or driving a car.  She was instructed to call should there be any fever. She should call with heavy vaginal bleeding.  Nausea and vomiting should be reported.  Excessive pain should be reported.  Instructed on signs and symptoms of deep venous thrombosis and pulmonary embolus. Discharged home on Percocet as needed for pain.  PLAN:  Follow up in the office in 1 week.     Darlyn Chamber, M.D.     JSM/MEDQ  D:  01/27/2015  T:  01/27/2015  Job:  [id number]

## 2015-01-27 NOTE — Op Note (Signed)
NAMECASHE, GATT NO.:  [id number]  MEDICAL RECORD NO.:  [id number]  LOCATION:  5409                         FACILITY:  [facility]  PHYSICIAN:  Darlyn Chamber, M.D.   DATE OF BIRTH:  1975-06-03  DATE OF PROCEDURE: DATE OF DISCHARGE:                              OPERATIVE REPORT   PREOPERATIVE DIAGNOSIS:  Longstanding history of abnormal uterine bleeding with associated menorrhagia.  Also having problems with significant dyspareunia, thought to be secondary to adenomyosis.  POSTOPERATIVE DIAGNOSIS:  Longstanding history of abnormal uterine bleeding with associated menorrhagia.  Also having problems with significant dyspareunia, thought to be secondary to adenomyosis.  OPERATIVE PROCEDURE:  Laparoscopic-assisted vaginal hysterectomy with removal of the distal ends of both fallopian tubes.  SURGEON:  Darlyn Chamber, M.D.  ANESTHESIA:  General endotracheal.  BLOOD LOSS:  Approximately 300-400 mL.  PACKS:  None.  DRAINS:  Included urethral Foley.  INTRAOPERATIVE BLOOD REPLACEMENT:  None.  COMPLICATIONS:  None.  INDICATIONS:  Dictated in history and physical.  PROCEDURE IN DETAIL:  The patient was taken to the OR and placed in supine position.  After satisfactory level of general endotracheal anesthesia was obtained, the patient was placed in dorsal lithotomy position using the Allen stirrups.  At this point in time, the abdomen, perineum, and vagina were prepped out with Betadine.  Bladder was emptied by in and out catheterization.  The Hulka tenaculum was put in place and secured.  The patient was draped in sterile field. Subumbilical incision made with knife and extended through the subcutaneous tissue.  Fascia was entered sharply.  Incision was fashioned laterally.  The peritoneum was entered with finger pressure. The open laparoscopic trocar was put in place and secured.  The abdomen was insufflated with carbon dioxide.  Laparoscope was introduced.   There was no evidence of injury to adjacent organs.  A 5-mm trocar was put in place at suprapubic area under direct visualization.  Appendix was clear.  Upper abdomen including the liver tip and gallbladder were clear.  She did have previous bilateral tubal ligation and distally, the fimbriated end attached to each ovary.  At that time, we were somewhat distant from the uterus.  At this point in time, using the EnSeal, removed the distal ends of both fallopian tubes from the ovaries and these were sent for Pathology.  Next, we went to the right side.  The right utero-ovarian pedicle was cauterized and incised.  The right round ligament was cauterized and incised.  We then went to the left side.  The left utero-ovarian pedicle was cauterized and incised, and the left round ligament was cauterized and incised.  With this, we had good hemostasis and freeing of the uterus.  The abdomen was deflated with carbon dioxide, and laparoscope was removed.  The patient's legs were repositioned and the Hulka tenaculum was taken out.  The weighted speculum was placed in the vaginal vault.  Cervix was grasped with Ardis Hughs tenaculum.  The vaginal mucosa was excised using the Bovie.  Cul-de-sac was entered sharply. Both uterosacral ligaments were clamped, cut, and suture ligated with 0 Vicryl.  We then developed a bladder flap.  Paracervical tissue was clamped, cut,  and suture ligated with 0 Vicryl.  We entered the vesicouterine space, retracted the bladder out superiorly.  Using clamp, cut, and tie technique with suture ligature of 0 Vicryl, the parametrium was serially separated from sides of uterus.  The uterus was then flipped.  Remaining pedicles were clamped and cut.  Uterus and cervix were passed off the operative field.  Held the pedicles, secured with free tie of 0 chromic.  An area of oozing from the right side was brought in control with figure-of-eights of 0 Vicryl.  The posterior vaginal cuff  was then closed in a running locking suture of 0 Vicryl. Uterosacral plication stitch of 0 Vicryl was put in place and secured. The vaginal mucosa was reapproximated with interrupted sutures of 2-0 Vicryl.  Foley was placed to straight drain, clear urine output was noted.  The patient's legs were repositioned, and laparoscope was reintroduced. Abdomen was reinflated with carbon dioxide.  Because of the bowel dropping down, we had a difficult time seeing the cul-de-sac.  We put in a second 5 mm trocar in the left lower quadrant.  We used this to elevate the bowel.  We thoroughly irrigated the pelvis __________. These were eventually removed.  We had some oozing from the vaginal cuff, brought under control with the bipolar.  The right ovary was hemostatically intact.  The left ovary was hemostatically intact.  We thoroughly irrigated the pelvis again.  We removed all irrigation.  We then deflated the abdomen and reinflated, there was no active bleeding going on.  At this point in time, the abdomen was deflated with carbon dioxide.  All trocars removed.  Subumbilical incision in the fascia was closed with two figure-of-eights of 0 Vicryl, the skin with interrupted subcuticulars of 4-0 Vicryl.  The suprapubic incisions were closed with Dermabond.  Sponge, instrument, and needle count was correct by circulating nurse x2.  Foley catheter was clear at the time of closure. The patient did tolerate the procedure well.     Darlyn Chamber, M.D.     JSM/MEDQ  D:  01/26/2015  T:  01/26/2015  Job:  [id number]

## 2015-03-17 ENCOUNTER — Telehealth: Payer: Self-pay | Admitting: Endocrinology

## 2015-03-17 MED ORDER — LEVOTHYROXINE SODIUM 125 MCG PO TABS: 125.0000 ug | ORAL_TABLET | Freq: Every day | ORAL | Status: DC

## 2015-03-17 NOTE — Telephone Encounter (Signed)
Rx sent per pt's request.  

## 2015-03-17 NOTE — Telephone Encounter (Signed)
Patient need a refill of her Levothyroxine send of  [place], [place] [phone] (Phone) [phone] (Fax)

## 2015-05-10 HISTORY — PX: ABDOMINAL HYSTERECTOMY: SHX81

## 2015-09-17 ENCOUNTER — Telehealth: Payer: Self-pay | Admitting: Endocrinology

## 2015-09-17 NOTE — Telephone Encounter (Signed)
Patient need refill of synthroid send to  [place], [place] [phone] (Phone) [phone] (Fax)

## 2015-09-18 MED ORDER — LEVOTHYROXINE SODIUM 125 MCG PO TABS: 125.0000 ug | ORAL_TABLET | Freq: Every day | ORAL | Status: DC

## 2015-09-18 NOTE — Telephone Encounter (Signed)
Rx submitted per pt's request.  

## 2015-09-18 NOTE — Addendum Note (Signed)
Addended by: Verlin Grills T on: 09/18/2015 07:59 AM   Modules accepted: Orders

## 2016-01-14 ENCOUNTER — Ambulatory Visit: Payer: Self-pay | Admitting: Family Medicine

## 2016-03-22 ENCOUNTER — Other Ambulatory Visit: Payer: Self-pay | Admitting: Endocrinology

## 2016-03-22 NOTE — Telephone Encounter (Signed)
Please refill x 3 mos Ov is due 

## 2016-06-16 ENCOUNTER — Ambulatory Visit: Payer: Self-pay | Admitting: Family Medicine

## 2016-06-27 ENCOUNTER — Telehealth: Payer: Self-pay | Admitting: Endocrinology

## 2016-06-27 MED ORDER — LEVOTHYROXINE SODIUM 125 MCG PO TABS: ORAL_TABLET | ORAL | 0 refills | Status: DC

## 2016-06-27 NOTE — Telephone Encounter (Signed)
Pt called in to set up a follow up with Dr. Loanne Drilling and requested that a refill of her thyroid medication be sent to [facility]. She will be out this week.

## 2016-06-27 NOTE — Telephone Encounter (Signed)
Refill submitted and patient scheduled for 2/23/018.

## 2016-07-01 ENCOUNTER — Encounter: Payer: Self-pay | Admitting: Endocrinology

## 2016-07-01 ENCOUNTER — Ambulatory Visit (INDEPENDENT_AMBULATORY_CARE_PROVIDER_SITE_OTHER): Payer: BLUE CROSS/BLUE SHIELD | Admitting: Endocrinology

## 2016-07-01 VITALS — BP 122/82 | HR 96 | Ht 62.0 in | Wt 152.0 lb

## 2016-07-01 DIAGNOSIS — E890 Postprocedural hypothyroidism: Secondary | ICD-10-CM | POA: Diagnosis not present

## 2016-07-01 LAB — TSH: TSH: 6.64 u[IU]/mL — AB (ref 0.35–4.50)

## 2016-07-01 NOTE — Progress Notes (Signed)
Subjective:    Patient ID: [name], female    DOB: Jan 11, 1976, 41 y.o.   MRN: [id number]  HPI Pt returns for f/u of postsurgical hypothyroidism, and multinodular goiter (dx'ed 2014; US showed 4.1 cm predominately solid nodule seen in the inferior portion of right thyroid lobe; nuc med scan showed the large nodule was hyperfunctioning; she had RAI in early 2015; in Sept of 2015, she was rx'ed synthroid due to high TSH; f/u US in early 2016 was improved; she had thyroidectomy in mid-2016; pathol showed FIBROTIC ADENOMATOUS NODULE).  pt states she feels well in general.  She takes synthroid as rx'ed.   Past Medical History:  Diagnosis Date  . Dysmenorrhea   . History of goiter    multinodalar thyroid goiter--  s/p  total thyroidectomy---  per path,  Fibrotic adenomatous nodule   . History of hyperthyroidism    RAI tx March 2015  . Hypothyroidism, postradioiodine therapy   . Menorrhagia   . Uterus, adenomyosis     Past Surgical History:  Procedure Laterality Date  . CESAREAN SECTION  07-17-2006  . LAPAROSCOPIC TUBAL LIGATION  08-23-2006   w/  fulgeration billaterlly  . THYROIDECTOMY N/A 08/19/2014   Procedure: TOTAL THYROIDECTOMY;  Surgeon: [name], MD;  Location: [place];  Service: General;  Laterality: N/A;    Social History   Social History  . Marital status: Married    Spouse name: N/A  . Number of children: N/A  . Years of education: N/A   Occupational History  . Not on file.   Social History Main Topics  . Smoking status: Former Smoker    Packs/day: 0.25    Years: 4.00    Types: Cigarettes    Quit date: 01/19/2010  . Smokeless tobacco: Never Used  . Alcohol use 3.0 oz/week    6 Standard drinks or equivalent per week     Comment: weekends-- 3 to 4  . Drug use: No  . Sexual activity: Not on file   Other Topics Concern  . Not on file   Social History Narrative  . No narrative on file    Current Outpatient Prescriptions on File Prior to Visit    Medication Sig Dispense Refill  . loratadine (CLARITIN) 10 MG tablet Take 10 mg by mouth 2 (two) times daily.    Marland Kitchen menthol-cetylpyridinium (CEPACOL) 3 MG lozenge Take 1 lozenge (3 mg total) by mouth every 2 (two) hours as needed for sore throat. 100 tablet 12   No current facility-administered medications on file prior to visit.     Allergies  Allergen Reactions  . Allegra [Fexofenadine] Rash    Family History  Problem Relation Age of Onset  . Diabetes Father   . Heart disease Father   . Hypertension Father   . Cancer Maternal Grandmother     BP 122/82   Pulse 96   Ht 5\' 2"  (1.575 m)   Wt 152 lb (68.9 kg)   SpO2 98%   BMI 27.80 kg/m   Review of Systems Denies edema.     Objective:   Physical Exam VITAL SIGNS:  See vs page GENERAL: no distress Neck: a healed scar is present.  i do not appreciate a nodule in the thyroid or elsewhere in the neck.    Lab Results  Component Value Date   TSH 6.64 (H) 07/01/2016   T3TOTAL 191.0 01/14/2013      Assessment & Plan:  Hypothyroidism: she needs increased rx.  I have  sent a prescription to your pharmacy.

## 2016-07-01 NOTE — Patient Instructions (Addendum)
blood tests are requested for you today.  We'll let you know about the results.  

## 2016-07-02 MED ORDER — LEVOTHYROXINE SODIUM 137 MCG PO TABS: 137.0000 ug | ORAL_TABLET | Freq: Every day | ORAL | 3 refills | Status: DC

## 2016-09-20 DIAGNOSIS — S93491A Sprain of other ligament of right ankle, initial encounter: Secondary | ICD-10-CM | POA: Diagnosis not present

## 2016-12-06 ENCOUNTER — Encounter: Payer: Self-pay | Admitting: Family Medicine

## 2016-12-06 ENCOUNTER — Ambulatory Visit (INDEPENDENT_AMBULATORY_CARE_PROVIDER_SITE_OTHER): Payer: BLUE CROSS/BLUE SHIELD | Admitting: Family Medicine

## 2016-12-06 VITALS — BP 104/62 | HR 76 | Temp 97.7°F | Resp 16 | Ht 63.0 in | Wt 152.0 lb

## 2016-12-06 DIAGNOSIS — Z0000 Encounter for general adult medical examination without abnormal findings: Secondary | ICD-10-CM

## 2016-12-06 LAB — POCT URINALYSIS DIPSTICK
Bilirubin, UA: NEGATIVE
Blood, UA: NEGATIVE
Glucose, UA: NEGATIVE
LEUKOCYTES UA: NEGATIVE
NITRITE UA: NEGATIVE
PROTEIN UA: NEGATIVE
Spec Grav, UA: 1.01 (ref 1.010–1.025)
Urobilinogen, UA: 0.2 E.U./dL
pH, UA: 6.5 (ref 5.0–8.0)

## 2016-12-06 NOTE — Progress Notes (Signed)
Patient: [name], Female    DOB: 26-May-1975, 41 y.o.   MRN: [id number] Visit Date: 12/06/2016  Today's Provider: [name], MD   Chief Complaint  Patient presents with  . New Patient (Initial Visit)  . Annual Exam   Subjective:    Annual physical exam [name] is a 41 y.o. female who presents today for health maintenance and complete physical. She feels fairly well. She reports she is not exercising, but plans to get back into it soon. She reports she is sleeping well.Smokes 1 ppd. She is married and the mother of 2 . She is a Counsellor for [employer]. ----------------------------------------------------------------- Dr. Radene Knee at [facility] in [place]. Pt has had hysterectomy last year due to irregular periods. Mammogram done last year but just got noticed she was due. She will get that through them.  Colonoscopy- Chronic colitis, Dr. Earlean Shawl   Review of Systems  Constitutional: Negative.   HENT: Negative.   Eyes: Negative.   Respiratory: Negative.   Cardiovascular: Negative.   Gastrointestinal: Positive for diarrhea and nausea.  Endocrine: Negative.   Genitourinary: Negative.   Musculoskeletal: Negative.   Skin: Negative.   Allergic/Immunologic: Negative.   Neurological: Negative.   Hematological: Negative.   Psychiatric/Behavioral: Negative.   Recent ankle sprain.  Social History      She  reports that she has been smoking Cigarettes.  She has a 10.00 pack-year smoking history. She has never used smokeless tobacco. She reports that she drinks about 3.0 oz of alcohol per week . She reports that she does not use drugs.       Social History   Social History  . Marital status: Married    Spouse name: N/A  . Number of children: N/A  . Years of education: N/A   Social History Main Topics  . Smoking status: Current Every Day Smoker    Packs/day: 1.00    Years: 10.00    Types: Cigarettes  . Smokeless tobacco: Never Used  .  Alcohol use 3.0 oz/week    6 Standard drinks or equivalent per week     Comment: weekends-- 3 to 4  . Drug use: No  . Sexual activity: Not Asked   Other Topics Concern  . None   Social History Narrative  . None    Past Medical History:  Diagnosis Date  . Dysmenorrhea   . History of goiter    multinodalar thyroid goiter--  s/p  total thyroidectomy---  per path,  Fibrotic adenomatous nodule   . History of hyperthyroidism    RAI tx March 2015  . Hypothyroidism, postradioiodine therapy   . Menorrhagia   . Uterus, adenomyosis      Patient Active Problem List   Diagnosis Date Noted  . Status post laparoscopic assisted vaginal hysterectomy 01/26/2015  . Hypothyroidism 12/31/2014  . S/P total thyroidectomy 08/19/2014  . Dyspnea 07/02/2013  . Thyroid mass 07/02/2013    Past Surgical History:  Procedure Laterality Date  . ABDOMINAL HYSTERECTOMY  2017  . CESAREAN SECTION  07-17-2006  . LAPAROSCOPIC TUBAL LIGATION  08-23-2006   w/  fulgeration billaterlly  . THYROIDECTOMY N/A 08/19/2014   Procedure: TOTAL THYROIDECTOMY;  Surgeon: [name], MD;  Location: [place];  Service: General;  Laterality: N/A;    Family History        Family Status  Relation Status  . Father Deceased  . MGM (Not Specified)  . Mother Alive  . Brother Alive  Her family history includes Cancer in her maternal grandmother; Diabetes in her father; Heart disease in her father; Hypertension in her father.     Allergies  Allergen Reactions  . Allegra [Fexofenadine] Rash     Current Outpatient Prescriptions:  .  levothyroxine (SYNTHROID, LEVOTHROID) 137 MCG tablet, Take 1 tablet (137 mcg total) by mouth daily before breakfast., Disp: 90 tablet, Rfl: 3 .  loratadine (CLARITIN) 10 MG tablet, Take 10 mg by mouth 2 (two) times daily., Disp: , Rfl:    Patient Care Team: Jerrol Banana., MD as PCP - General (Family Medicine)      Objective:   Vitals: BP 104/62 (BP Location: Left  Arm, Patient Position: Sitting, Cuff Size: Normal)   Pulse 76   Temp 97.7 F (36.5 C) (Oral)   Resp 16   Ht 5\' 3"  (1.6 m)   Wt 152 lb (68.9 kg)   LMP 01/23/2015   BMI 26.93 kg/m    Vitals:   12/06/16 1002  BP: 104/62  Pulse: 76  Resp: 16  Temp: 97.7 F (36.5 C)  TempSrc: Oral  Weight: 152 lb (68.9 kg)  Height: 5\' 3"  (1.6 m)     Physical Exam  Constitutional: She is oriented to person, place, and time. She appears well-developed and well-nourished.  HENT:  Head: Normocephalic and atraumatic.  Right Ear: External ear normal.  Left Ear: External ear normal.  Nose: Nose normal.  Mouth/Throat: Oropharynx is clear and moist.  Eyes: Pupils are equal, round, and reactive to light. Conjunctivae and EOM are normal.  Neck: Normal range of motion. Neck supple.  Cardiovascular: Normal rate, regular rhythm, normal heart sounds and intact distal pulses.   Pulmonary/Chest: Effort normal and breath sounds normal.  Abdominal: Soft. Bowel sounds are normal.  Musculoskeletal: Normal range of motion.  Neurological: She is alert and oriented to person, place, and time. She has normal reflexes.  Skin: Skin is warm and dry.  Psychiatric: She has a normal mood and affect. Her behavior is normal. Judgment and thought content normal.     Depression Screen PHQ 2/9 Scores 12/06/2016  PHQ - 2 Score 0  PHQ- 9 Score 0      Assessment & Plan:     Routine Health Maintenance and Physical Exam  Exercise Activities and Dietary recommendations Goals    None      Immunization History  Administered Date(s) Administered  . Tdap 08/23/2013    Health Maintenance  Topic Date Due  . HIV Screening  10/17/1990  . INFLUENZA VACCINE  12/07/2016  . PAP SMEAR  01/19/2018  . TETANUS/TDAP  08/24/2023    Breast/Pap/Pelvic/DRE per Gyn. Discussed health benefits of physical activity, and encouraged her to engage in regular exercise appropriate for her age and condition.      --------------------------------------------------------------------   I have done the exam and reviewed the above chart and it is accurate to the best of my knowledge. Development worker, community has been used in this note in any air is in the dictation or transcription are unintentional.  [name], MD  [place]

## 2016-12-20 DIAGNOSIS — Z0000 Encounter for general adult medical examination without abnormal findings: Secondary | ICD-10-CM | POA: Diagnosis not present

## 2016-12-21 LAB — COMPREHENSIVE METABOLIC PANEL
ALK PHOS: 64 IU/L (ref 39–117)
ALT: 14 IU/L (ref 0–32)
AST: 20 IU/L (ref 0–40)
Albumin/Globulin Ratio: 1.8 (ref 1.2–2.2)
Albumin: 4.4 g/dL (ref 3.5–5.5)
BUN/Creatinine Ratio: 11 (ref 9–23)
BUN: 8 mg/dL (ref 6–24)
Bilirubin Total: 0.3 mg/dL (ref 0.0–1.2)
CO2: 23 mmol/L (ref 20–29)
CREATININE: 0.75 mg/dL (ref 0.57–1.00)
Calcium: 9.2 mg/dL (ref 8.7–10.2)
Chloride: 104 mmol/L (ref 96–106)
GFR, EST AFRICAN AMERICAN: 114 mL/min/{1.73_m2} (ref >59)
GFR, EST NON AFRICAN AMERICAN: 99 mL/min/{1.73_m2} (ref >59)
GLUCOSE: 91 mg/dL (ref 65–99)
Globulin, Total: 2.4 g/dL (ref 1.5–4.5)
Potassium: 4 mmol/L (ref 3.5–5.2)
SODIUM: 141 mmol/L (ref 134–144)
Total Protein: 6.8 g/dL (ref 6.0–8.5)

## 2016-12-21 LAB — CBC WITH DIFFERENTIAL/PLATELET
BASOS ABS: 0.1 10*3/uL (ref 0.0–0.2)
Basos: 1 %
EOS (ABSOLUTE): 0.5 10*3/uL — ABNORMAL HIGH (ref 0.0–0.4)
EOS: 5 %
HEMATOCRIT: 42.4 % (ref 34.0–46.6)
HEMOGLOBIN: 14.7 g/dL (ref 11.1–15.9)
Immature Grans (Abs): 0 10*3/uL (ref 0.0–0.1)
Immature Granulocytes: 0 %
LYMPHS ABS: 1.8 10*3/uL (ref 0.7–3.1)
Lymphs: 17 %
MCH: 32.2 pg (ref 26.6–33.0)
MCHC: 34.7 g/dL (ref 31.5–35.7)
MCV: 93 fL (ref 79–97)
MONOCYTES: 6 %
Monocytes Absolute: 0.6 10*3/uL (ref 0.1–0.9)
NEUTROS ABS: 7.8 10*3/uL — AB (ref 1.4–7.0)
Neutrophils: 71 %
Platelets: 272 10*3/uL (ref 150–379)
RBC: 4.57 x10E6/uL (ref 3.77–5.28)
RDW: 13 % (ref 12.3–15.4)
WBC: 10.8 10*3/uL (ref 3.4–10.8)

## 2016-12-21 LAB — LIPID PANEL WITH LDL/HDL RATIO
Cholesterol, Total: 207 mg/dL — ABNORMAL HIGH (ref 100–199)
HDL: 49 mg/dL (ref >39)
LDL CALC: 117 mg/dL — AB (ref 0–99)
LDL/HDL RATIO: 2.4 ratio (ref 0.0–3.2)
Triglycerides: 204 mg/dL — ABNORMAL HIGH (ref 0–149)
VLDL Cholesterol Cal: 41 mg/dL — ABNORMAL HIGH (ref 5–40)

## 2016-12-21 LAB — TSH: TSH: 2.41 u[IU]/mL (ref 0.450–4.500)

## 2017-01-25 ENCOUNTER — Ambulatory Visit (INDEPENDENT_AMBULATORY_CARE_PROVIDER_SITE_OTHER): Payer: BLUE CROSS/BLUE SHIELD | Admitting: Family Medicine

## 2017-01-25 ENCOUNTER — Encounter: Payer: Self-pay | Admitting: Family Medicine

## 2017-01-25 VITALS — BP 132/80 | HR 72 | Temp 97.6°F | Resp 14 | Wt 154.0 lb

## 2017-01-25 DIAGNOSIS — H1032 Unspecified acute conjunctivitis, left eye: Secondary | ICD-10-CM | POA: Diagnosis not present

## 2017-01-25 DIAGNOSIS — J329 Chronic sinusitis, unspecified: Secondary | ICD-10-CM

## 2017-01-25 MED ORDER — AMOXICILLIN-POT CLAVULANATE 875-125 MG PO TABS
1.0000 | ORAL_TABLET | Freq: Two times a day (BID) | ORAL | 0 refills | Status: DC
Start: 2017-01-25 — End: 2017-10-23

## 2017-01-25 MED ORDER — CIPROFLOXACIN HCL 0.3 % OP SOLN: 2.0000 [drp] | OPHTHALMIC | 0 refills | Status: DC

## 2017-01-25 NOTE — Progress Notes (Signed)
Patient: [name] Female    DOB: 07/06/1975   41 y.o.   MRN: [id number] Visit Date: 01/25/2017  Today's Provider: [name], MD   Chief Complaint  Patient presents with  . URI  . Eye Problem   Subjective:    HPI Pt is here for cough, congestion, sinus pain, pressure, headache, eye irritation, itching, redness and matting. She reports that she started having a cough last week but it progressed and she started feeling bad and started having a lot of sinus pain and pressure. Then last night she felt like something was in her left eye. She put some eye drops in her eye and applied warm compresses and that seem to help, when she woke up this morning her eye was matted shut and swollen. She reports that it does itch and the right eye has started to itch some today. She is also having diarrhea and some nausea. She has vomited but has been after she cough heavily.        Allergies  Allergen Reactions  . Allegra [Fexofenadine] Rash     Current Outpatient Prescriptions:  .  levothyroxine (SYNTHROID, LEVOTHROID) 137 MCG tablet, Take 1 tablet (137 mcg total) by mouth daily before breakfast., Disp: 90 tablet, Rfl: 3 .  loratadine (CLARITIN) 10 MG tablet, Take 10 mg by mouth 2 (two) times daily., Disp: , Rfl:   Review of Systems  Constitutional: Positive for fatigue.  HENT: Positive for congestion, sinus pain, sinus pressure and sneezing.   Eyes: Positive for pain, discharge, redness and itching.  Respiratory: Positive for cough.   Cardiovascular: Negative.   Gastrointestinal: Positive for diarrhea and nausea.  Endocrine: Negative.   Genitourinary: Negative.   Musculoskeletal: Negative.   Skin: Negative.   Allergic/Immunologic: Negative.   Neurological: Negative.   Psychiatric/Behavioral: Negative.     Social History  Substance Use Topics  . Smoking status: Current Every Day Smoker    Packs/day: 1.00    Years: 10.00    Types: Cigarettes  . Smokeless  tobacco: Never Used  . Alcohol use 3.0 oz/week    6 Standard drinks or equivalent per week     Comment: weekends-- 3 to 4   Objective:   BP 132/80 (BP Location: Left Arm, Patient Position: Sitting, Cuff Size: Normal)   Pulse 72   Temp 97.6 F (36.4 C) (Oral)   Resp 14   Wt 154 lb (69.9 kg)   LMP 01/23/2015   SpO2 99%   BMI 27.28 kg/m  Vitals:   01/25/17 1457  BP: 132/80  Pulse: 72  Resp: 14  Temp: 97.6 F (36.4 C)  TempSrc: Oral  SpO2: 99%  Weight: 154 lb (69.9 kg)     Physical Exam  Constitutional: She is oriented to person, place, and time. She appears well-developed and well-nourished.  HENT:  Head: Normocephalic and atraumatic.  Right Ear: External ear normal.  Left Ear: External ear normal.  Nose: Nose normal.  Mouth/Throat: Oropharynx is clear and moist.  Mild overall sinus tenderness  Eyes: Pupils are equal, round, and reactive to light. Conjunctivae and EOM are normal.  Neck: Normal range of motion. Neck supple.  Cardiovascular: Normal rate, regular rhythm, normal heart sounds and intact distal pulses.   Pulmonary/Chest: Effort normal and breath sounds normal.  Musculoskeletal: Normal range of motion.  Lymphadenopathy:    She has no cervical adenopathy.  Neurological: She is alert and oriented to person, place, and time. She has normal  reflexes.  Skin: Skin is warm and dry.  Psychiatric: She has a normal mood and affect. Her behavior is normal. Judgment and thought content normal.        Assessment & Plan:     1. Sinusitis, unspecified chronicity, unspecified location  - amoxicillin-clavulanate (AUGMENTIN) 875-125 MG tablet; Take 1 tablet by mouth 2 (two) times daily.  Dispense: 20 tablet; Refill: 0  2. Acute conjunctivitis of left eye, unspecified acute conjunctivitis type  - ciprofloxacin (CILOXAN) 0.3 % ophthalmic solution; Place 2 drops into the left eye every 2 (two) hours. 1 drop, every 2 hours, while awake, for 2 days. Then 1 drop, every 4  hours, while awake, for the next 5 days.  Dispense: 5 mL; Refill: 0  3. Tobacco abuse  patient encouraged to discontinue    HPI, Exam, and A&P Transcribed under the direction and in the presence of Syd Manges L. Cranford Mon, MD  Electronically Signed: Katina Dung, [place], MD  [facility]

## 2017-07-18 ENCOUNTER — Telehealth: Payer: Self-pay | Admitting: Endocrinology

## 2017-07-18 ENCOUNTER — Other Ambulatory Visit: Payer: Self-pay

## 2017-07-18 MED ORDER — LEVOTHYROXINE SODIUM 137 MCG PO TABS: 137.0000 ug | ORAL_TABLET | Freq: Every day | ORAL | 3 refills | Status: DC

## 2017-07-18 NOTE — Telephone Encounter (Signed)
NEW Pharmacy is [facility] [address]

## 2017-07-18 NOTE — Telephone Encounter (Signed)
[facility] on [address].

## 2017-07-18 NOTE — Telephone Encounter (Signed)
Patient needs new RX for Levothyroxine 137 mcg tab sent to NEW Pharmacy:

## 2017-10-23 ENCOUNTER — Encounter: Payer: Self-pay | Admitting: Family Medicine

## 2017-10-23 ENCOUNTER — Other Ambulatory Visit: Payer: Self-pay

## 2017-10-23 ENCOUNTER — Ambulatory Visit: Payer: BLUE CROSS/BLUE SHIELD | Admitting: Family Medicine

## 2017-10-23 VITALS — BP 118/80 | HR 81 | Temp 98.3°F | Resp 17 | Ht 63.0 in | Wt 154.0 lb

## 2017-10-23 DIAGNOSIS — H9202 Otalgia, left ear: Secondary | ICD-10-CM

## 2017-10-23 MED ORDER — ACETIC ACID 2 % OT SOLN: 4.0000 [drp] | Freq: Three times a day (TID) | OTIC | 0 refills | Status: DC

## 2017-10-23 MED ORDER — CETIRIZINE HCL 10 MG PO TABS
10.0000 mg | ORAL_TABLET | Freq: Every day | ORAL | 11 refills | Status: DC
Start: 2017-10-23 — End: 2021-01-06

## 2017-10-23 NOTE — Patient Instructions (Addendum)
IF you received an x-ray today, you will receive an invoice from [facility]. Please contact [facility] at [phone] with questions or concerns regarding your invoice.   IF you received labwork today, you will receive an invoice from [facility]. Please contact [facility] at [phone] with questions or concerns regarding your invoice.   Our billing staff will not be able to assist you with questions regarding bills from these companies.  You will be contacted with the lab results as soon as they are available. The fastest way to get your results is to activate your My Chart account. Instructions are located on the last page of this paperwork. If you have not heard from [place] regarding the results in 2 weeks, please contact this office.    We recommend that you schedule a mammogram for breast cancer screening. Typically, you do not need a referral to do this. Please contact a local imaging center to schedule your mammogram.  [facility] - [phone]  *ask for the Radiology Department [facility] - [phone] or [phone]  [facility] - [phone] [place] [phone] [facility] - [phone]  *ask for the [facility] - [phone]  *ask for the Radiology Department [facility] - [phone]  *ask for the [facility] - [phone] Earache, Adult An earache, or ear pain, can be caused by many things, including:  An infection.  Ear wax buildup.  Ear pressure.  Something in the ear that should not be there (foreign body).  A sore throat.  Tooth problems.  Jaw problems.  Treatment of the earache will depend on the cause. If the cause is not clear or cannot be determined, you may need to watch your symptoms until your earache goes away or until a cause is found. Follow these  instructions at home: Pay attention to any changes in your symptoms. Take these actions to help with your pain:  Take or apply over-the-counter and prescription medicines only as told by your health care provider.  If you were prescribed an antibiotic medicine, use it as told by your health care provider. Do not stop using the antibiotic even if you start to feel better.  Do not put anything in your ear other than medicine that is prescribed by your health care provider.  If directed, apply heat to the affected area as often as told by your health care provider. Use the heat source that your health care provider recommends, such as a moist heat pack or a heating pad. ? Place a towel between your skin and the heat source. ? Leave the heat on for 20-30 minutes. ? Remove the heat if your skin turns bright red. This is especially important if you are unable to feel pain, heat, or cold. You may have a greater risk of getting burned.  If directed, put ice on the ear: ? Put ice in a plastic bag. ? Place a towel between your skin and the bag. ? Leave the ice on for 20 minutes, 2-3 times a day.  Try resting in an upright position instead of lying down. This may help to reduce pressure in your ear and relieve pain.  Chew gum if it helps to relieve your ear pain.  Treat any allergies as told by your health care provider.  Keep all follow-up visits as told by your health care provider. This is important.  Contact a health care provider if:  Your pain does not improve within 2 days.  Your earache gets worse.  You have new symptoms.  You have a fever. Get help right away if:  You have a severe headache.  You have a stiff neck.  You have trouble swallowing.  You have redness or swelling behind your ear.  You have fluid or blood coming from your ear.  You have hearing loss.  You feel dizzy. This information is not intended to replace advice given to you by your health care  provider. Make sure you discuss any questions you have with your health care provider. Document Released: 12/11/2003 Document Revised: 12/22/2015 Document Reviewed: 10/19/2015 Elsevier Interactive Patient Education  [employer].

## 2017-10-23 NOTE — Progress Notes (Signed)
Chief Complaint  Patient presents with  . earache both ears    onset: 10/20/17 and st on 6/16 and throat is sore today, pain in ears radiates down to neck, per pt no fevers present, tried sweet oil in ears on saturday and helped but pain returned on sunday, not taking any otc meds for symptoms    HPI  Otalgia Worse on the left Started this week around 10/20/17 She reports that she had some pain that felt like pressure She tried sweet oil in the ears which seemed to help She denies fevers  She reports that she has not taken any antihistamines No difficulty swallowing or chewing   Past Medical History:  Diagnosis Date  . Dysmenorrhea   . History of goiter    multinodalar thyroid goiter--  s/p  total thyroidectomy---  per path,  Fibrotic adenomatous nodule   . History of hyperthyroidism    RAI tx March 2015  . Hypothyroidism, postradioiodine therapy   . Menorrhagia   . Uterus, adenomyosis     Current Outpatient Medications  Medication Sig Dispense Refill  . levothyroxine (SYNTHROID, LEVOTHROID) 137 MCG tablet Take 1 tablet (137 mcg total) by mouth daily before breakfast. 90 tablet 3  . loratadine (CLARITIN) 10 MG tablet Take 10 mg by mouth 2 (two) times daily.    Marland Kitchen acetic acid 2 % otic solution Place 4 drops into both ears 3 (three) times daily. 15 mL 0  . cetirizine (ZYRTEC) 10 MG tablet Take 1 tablet (10 mg total) by mouth daily. 30 tablet 11   No current facility-administered medications for this visit.     Allergies:  Allergies  Allergen Reactions  . Allegra [Fexofenadine] Rash    Past Surgical History:  Procedure Laterality Date  . ABDOMINAL HYSTERECTOMY  2017  . CESAREAN SECTION  07-17-2006  . LAPAROSCOPIC TUBAL LIGATION  08-23-2006   w/  fulgeration billaterlly  . THYROIDECTOMY N/A 08/19/2014   Procedure: TOTAL THYROIDECTOMY;  Surgeon: [name], MD;  Location: [place];  Service: General;  Laterality: N/A;    Social History   Socioeconomic History  .  Marital status: Married    Spouse name: Not on file  . Number of children: Not on file  . Years of education: Not on file  . Highest education level: Not on file  Occupational History  . Not on file  Social Needs  . Financial resource strain: Not on file  . Food insecurity:    Worry: Not on file    Inability: Not on file  . Transportation needs:    Medical: Not on file    Non-medical: Not on file  Tobacco Use  . Smoking status: Current Every Day Smoker    Packs/day: 1.00    Years: 10.00    Pack years: 10.00    Types: Cigarettes  . Smokeless tobacco: Never Used  Substance and Sexual Activity  . Alcohol use: Yes    Alcohol/week: 3.6 oz    Types: 6 Standard drinks or equivalent per week    Comment: weekends-- 3 to 4  . Drug use: No  . Sexual activity: Not on file  Lifestyle  . Physical activity:    Days per week: Not on file    Minutes per session: Not on file  . Stress: Not on file  Relationships  . Social connections:    Talks on phone: Not on file    Gets together: Not on file    Attends religious service: Not on file  Active member of club or organization: Not on file    Attends meetings of clubs or organizations: Not on file    Relationship status: Not on file  Other Topics Concern  . Not on file  Social History Narrative  . Not on file    Family History  Problem Relation Age of Onset  . Diabetes Father   . Heart disease Father   . Hypertension Father   . Cancer Maternal Grandmother   . Colon cancer Paternal Grandmother   . Ulcerative colitis Paternal Grandmother      ROS Review of Systems See HPI Constitution: No fevers or chills No malaise No diaphoresis Skin: No rash or itching Eyes: no blurry vision, no double vision GU: no dysuria or hematuria Neuro: no dizziness or headaches  all others reviewed and negative   Objective: Vitals:   10/23/17 1615  BP: 118/80  Pulse: 81  Resp: 17  Temp: 98.3 F (36.8 C)  TempSrc: Oral  SpO2: 99%    Weight: 154 lb (69.9 kg)  Height: 5\' 3"  (1.6 m)    Physical Exam General: alert, oriented, in NAD Head: normocephalic, atraumatic, no sinus tenderness Eyes: EOM intact, no scleral icterus or conjunctival injection Ears: TM clear bilaterally Nose: mucosa nonerythematous, nonedematous Throat: no pharyngeal exudate or erythema Lymph: no posterior auricular, submental or cervical lymph adenopathy Heart: normal rate, normal sinus rhythm, no murmurs Lungs: clear to auscultation bilaterally, no wheezing  Assessment and Plan [name] was seen today for earache both ears.  Diagnoses and all orders for this visit:  Otalgia of left ear  Other orders -     acetic acid 2 % otic solution; Place 4 drops into both ears 3 (three) times daily. -     cetirizine (ZYRTEC) 10 MG tablet; Take 1 tablet (10 mg total) by mouth daily.  supportive care No infections signs    [name] A Nolon Rod

## 2017-10-31 ENCOUNTER — Ambulatory Visit: Payer: BLUE CROSS/BLUE SHIELD | Admitting: Family Medicine

## 2017-10-31 VITALS — BP 118/76 | HR 88 | Temp 98.2°F | Resp 14 | Wt 156.0 lb

## 2017-10-31 DIAGNOSIS — H6502 Acute serous otitis media, left ear: Secondary | ICD-10-CM | POA: Diagnosis not present

## 2017-10-31 NOTE — Patient Instructions (Signed)
OTC Sudafed

## 2017-10-31 NOTE — Progress Notes (Signed)
[name]  MRN: [id number] DOB: [birth year]  Subjective:  HPI   The patient is a 42 year old female who presents with left ear pain.  She states she has had symptoms since 10/19/17.  She was seen at Urgent Care on 10/23/17 and was treated with ear drops for swimmers ear.  She states that the symptoms have not gotten any better and seem to be worsening.  She states with the ear pain she has also had swelling of the neck and should and pain in her arm and neck.  She denies any fever at all.  Patient Active Problem List   Diagnosis Date Noted  . Status post laparoscopic assisted vaginal hysterectomy 01/26/2015  . Hypothyroidism 12/31/2014  . S/P total thyroidectomy 08/19/2014  . Dyspnea 07/02/2013  . Thyroid mass 07/02/2013    Past Medical History:  Diagnosis Date  . Dysmenorrhea   . History of goiter    multinodalar thyroid goiter--  s/p  total thyroidectomy---  per path,  Fibrotic adenomatous nodule   . History of hyperthyroidism    RAI tx March 2015  . Hypothyroidism, postradioiodine therapy   . Menorrhagia   . Uterus, adenomyosis     Social History   Socioeconomic History  . Marital status: Married    Spouse name: Not on file  . Number of children: Not on file  . Years of education: Not on file  . Highest education level: Not on file  Occupational History  . Not on file  Social Needs  . Financial resource strain: Not on file  . Food insecurity:    Worry: Not on file    Inability: Not on file  . Transportation needs:    Medical: Not on file    Non-medical: Not on file  Tobacco Use  . Smoking status: Current Every Day Smoker    Packs/day: 1.00    Years: 10.00    Pack years: 10.00    Types: Cigarettes  . Smokeless tobacco: Never Used  Substance and Sexual Activity  . Alcohol use: Yes    Alcohol/week: 3.6 oz    Types: 6 Standard drinks or equivalent per week    Comment: weekends-- 3 to 4  . Drug use: No  . Sexual activity: Not on file  Lifestyle  .  Physical activity:    Days per week: Not on file    Minutes per session: Not on file  . Stress: Not on file  Relationships  . Social connections:    Talks on phone: Not on file    Gets together: Not on file    Attends religious service: Not on file    Active member of club or organization: Not on file    Attends meetings of clubs or organizations: Not on file    Relationship status: Not on file  . Intimate partner violence:    Fear of current or ex partner: Not on file    Emotionally abused: Not on file    Physically abused: Not on file    Forced sexual activity: Not on file  Other Topics Concern  . Not on file  Social History Narrative  . Not on file    Outpatient Encounter Medications as of 10/31/2017  Medication Sig  . acetic acid 2 % otic solution Place 4 drops into both ears 3 (three) times daily.  . cetirizine (ZYRTEC) 10 MG tablet Take 1 tablet (10 mg total) by mouth daily.  Marland Kitchen levothyroxine (SYNTHROID, LEVOTHROID) 137 MCG tablet  Take 1 tablet (137 mcg total) by mouth daily before breakfast.  . loratadine (CLARITIN) 10 MG tablet Take 10 mg by mouth 2 (two) times daily.   No facility-administered encounter medications on file as of 10/31/2017.     Allergies  Allergen Reactions  . Allegra [Fexofenadine] Rash    Review of Systems  Constitutional: Negative for fever and malaise/fatigue.  HENT: Positive for ear pain and sore throat. Negative for congestion, ear discharge, hearing loss, nosebleeds, sinus pain and tinnitus.   Eyes: Negative for blurred vision, double vision, photophobia, pain, discharge and redness.  Respiratory: Positive for cough (minimal, non productive, smoker). Negative for shortness of breath and wheezing.   Cardiovascular: Negative for chest pain, palpitations, orthopnea and leg swelling.  Gastrointestinal: Negative.   Endo/Heme/Allergies: Negative.   Psychiatric/Behavioral: Negative.     Objective:  BP 118/76 (BP Location: Right Arm, Patient  Position: Sitting, Cuff Size: Normal)   Pulse 88   Temp 98.2 F (36.8 C) (Oral)   Resp 14   Wt 156 lb (70.8 kg)   LMP 01/23/2015   BMI 27.63 kg/m   Physical Exam  Constitutional: She is oriented to person, place, and time and well-developed, well-nourished, and in no distress.  HENT:  Head: Normocephalic and atraumatic.  Right Ear: External ear normal.  Left Ear: External ear normal.  Nose: Nose normal.  Mouth/Throat: Oropharynx is clear and moist.  Left TM full,bulging.  Eyes: Conjunctivae are normal. No scleral icterus.  Neck: No thyromegaly present.  Cardiovascular: Normal rate, regular rhythm and normal heart sounds.  Pulmonary/Chest: Effort normal and breath sounds normal.  Abdominal: Soft.  Neurological: She is alert and oriented to person, place, and time. Gait normal. GCS score is 15.  Skin: Skin is warm and dry.  Psychiatric: Mood, memory, affect and judgment normal.    Assessment and Plan :  Serous Otitis Media Time,fluids,OTC sudafed for a few days.  I have done the exam and reviewed the chart and it is accurate to the best of my knowledge. Development worker, community has been used and  any errors in dictation or transcription are unintentional. Miguel Aschoff M.D. [facility]

## 2017-12-07 ENCOUNTER — Encounter: Payer: Self-pay | Admitting: Family Medicine

## 2018-06-18 ENCOUNTER — Telehealth: Payer: Self-pay | Admitting: Family Medicine

## 2018-06-18 DIAGNOSIS — Z720 Tobacco use: Secondary | ICD-10-CM

## 2018-06-18 NOTE — Telephone Encounter (Signed)
Pt would like a rx for the Chantix to help her stop smoking  [facility]  CB# [phone]  thanks [employer]

## 2018-06-18 NOTE — Telephone Encounter (Signed)
Please review. Thanks!  

## 2018-06-20 MED ORDER — VARENICLINE TARTRATE 0.5 MG X 11 & 1 MG X 42 PO MISC: ORAL | 0 refills | Status: DC

## 2018-06-20 NOTE — Telephone Encounter (Signed)
Sent medication into the pharmacy.

## 2018-06-20 NOTE — Telephone Encounter (Signed)
Ok--starter pack first

## 2018-07-12 ENCOUNTER — Other Ambulatory Visit: Payer: Self-pay | Admitting: Endocrinology

## 2018-07-16 ENCOUNTER — Other Ambulatory Visit: Payer: Self-pay | Admitting: Endocrinology

## 2018-07-17 ENCOUNTER — Ambulatory Visit: Payer: BLUE CROSS/BLUE SHIELD | Admitting: Endocrinology

## 2018-07-17 ENCOUNTER — Encounter: Payer: Self-pay | Admitting: Endocrinology

## 2018-07-17 ENCOUNTER — Other Ambulatory Visit: Payer: Self-pay

## 2018-07-17 VITALS — BP 118/70 | HR 92 | Ht 63.0 in | Wt 164.2 lb

## 2018-07-17 DIAGNOSIS — E890 Postprocedural hypothyroidism: Secondary | ICD-10-CM | POA: Diagnosis not present

## 2018-07-17 LAB — T4, FREE: Free T4: 0.93 ng/dL (ref 0.60–1.60)

## 2018-07-17 LAB — TSH: TSH: 1.92 u[IU]/mL (ref 0.35–4.50)

## 2018-07-17 NOTE — Patient Instructions (Addendum)
blood tests are requested for you today.  We'll let you know about the results. Please come back for a follow-up appointment in 1 year.   

## 2018-07-17 NOTE — Progress Notes (Signed)
Subjective:    Patient ID: [name], female    DOB: 02-16-1976, 43 y.o.   MRN: [id number]  HPI Pt returns for f/u of postsurgical hypothyroidism, and multinodular goiter (dx'ed 2014; US showed 4.1 cm predominately solid nodule seen in the inferior portion of right thyroid lobe; nuc med scan showed the large nodule was hyperfunctioning; she had RAI in early 2015; in Sept of 2015, she was rx'ed synthroid due to high TSH; f/u US in early 2016 was improved; she had thyroidectomy in mid-2016; pathol showed FIBROTIC ADENOMATOUS NODULE).  pt states she feels well in general.  She takes synthroid as rx'ed.   Past Medical History:  Diagnosis Date  . Dysmenorrhea   . History of goiter    multinodalar thyroid goiter--  s/p  total thyroidectomy---  per path,  Fibrotic adenomatous nodule   . History of hyperthyroidism    RAI tx March 2015  . Hypothyroidism, postradioiodine therapy   . Menorrhagia   . Uterus, adenomyosis     Past Surgical History:  Procedure Laterality Date  . ABDOMINAL HYSTERECTOMY  2017  . CESAREAN SECTION  07-17-2006  . LAPAROSCOPIC TUBAL LIGATION  08-23-2006   w/  fulgeration billaterlly  . THYROIDECTOMY N/A 08/19/2014   Procedure: TOTAL THYROIDECTOMY;  Surgeon: [name], MD;  Location: [facility];  Service: General;  Laterality: N/A;    Social History   Socioeconomic History  . Marital status: Married    Spouse name: Not on file  . Number of children: Not on file  . Years of education: Not on file  . Highest education level: Not on file  Occupational History  . Not on file  Social Needs  . Financial resource strain: Not on file  . Food insecurity:    Worry: Not on file    Inability: Not on file  . Transportation needs:    Medical: Not on file    Non-medical: Not on file  Tobacco Use  . Smoking status: Current Every Day Smoker    Packs/day: 1.00    Years: 10.00    Pack years: 10.00    Types: Cigarettes  . Smokeless tobacco: Never Used  Substance  and Sexual Activity  . Alcohol use: Yes    Alcohol/week: 6.0 standard drinks    Types: 6 Standard drinks or equivalent per week    Comment: weekends-- 3 to 4  . Drug use: No  . Sexual activity: Not on file  Lifestyle  . Physical activity:    Days per week: Not on file    Minutes per session: Not on file  . Stress: Not on file  Relationships  . Social connections:    Talks on phone: Not on file    Gets together: Not on file    Attends religious service: Not on file    Active member of club or organization: Not on file    Attends meetings of clubs or organizations: Not on file    Relationship status: Not on file  . Intimate partner violence:    Fear of current or ex partner: Not on file    Emotionally abused: Not on file    Physically abused: Not on file    Forced sexual activity: Not on file  Other Topics Concern  . Not on file  Social History Narrative  . Not on file    Current Outpatient Medications on File Prior to Visit  Medication Sig Dispense Refill  . acetic acid 2 % otic solution Place  4 drops into both ears 3 (three) times daily. 15 mL 0  . cetirizine (ZYRTEC) 10 MG tablet Take 1 tablet (10 mg total) by mouth daily. 30 tablet 11  . levothyroxine (SYNTHROID, LEVOTHROID) 137 MCG tablet TAKE 1 TABLET (137 MCG TOTAL) BY MOUTH DAILY BEFORE BREAKFAST. 90 tablet 3  . loratadine (CLARITIN) 10 MG tablet Take 10 mg by mouth 2 (two) times daily.    . varenicline (CHANTIX STARTING MONTH PAK) 0.5 MG X 11 & 1 MG X 42 tablet Take one 0.5 mg tablet by mouth once daily for 3 days, then increase to one 0.5 mg tablet twice daily for 4 days, then increase to one 1 mg tablet twice daily. 53 tablet 0   No current facility-administered medications on file prior to visit.     Allergies  Allergen Reactions  . Allegra [Fexofenadine] Rash    Family History  Problem Relation Age of Onset  . Diabetes Father   . Heart disease Father   . Hypertension Father   . Cancer Maternal  Grandmother   . Colon cancer Paternal Grandmother   . Ulcerative colitis Paternal Grandmother     BP 118/70 (BP Location: Right Arm, Patient Position: Sitting, Cuff Size: Normal)   Pulse 92   Ht 5\' 3"  (1.6 m)   Wt 164 lb 3.2 oz (74.5 kg)   LMP 01/23/2015   SpO2 97%   BMI 29.09 kg/m    Review of Systems Denies neck pain and swelling.     Objective:   Physical Exam VITAL SIGNS:  See vs page.  GENERAL: no distress Neck: a healed scar is present.  I do not appreciate a nodule in the thyroid or elsewhere in the neck.    Lab Results  Component Value Date   TSH 1.92 07/17/2018   T3TOTAL 191.0 01/14/2013      Assessment & Plan:  Hypothyroidism: well-replaced MNG: no clinical evidence of recurrence  Patient Instructions  blood tests are requested for you today.  We'll let you know about the results.   Please come back for a follow-up appointment in 1 year.

## 2018-07-19 ENCOUNTER — Other Ambulatory Visit: Payer: Self-pay | Admitting: Family Medicine

## 2018-07-19 NOTE — Telephone Encounter (Signed)
Patient needs refill on Chantix.  She is finishing starter pack this weekend.  [facility] [address] [name]

## 2018-07-20 MED ORDER — VARENICLINE TARTRATE 1 MG PO TABS: 1.0000 mg | ORAL_TABLET | Freq: Two times a day (BID) | ORAL | 2 refills | Status: DC

## 2019-01-29 ENCOUNTER — Other Ambulatory Visit: Payer: Self-pay

## 2019-01-29 ENCOUNTER — Ambulatory Visit (INDEPENDENT_AMBULATORY_CARE_PROVIDER_SITE_OTHER): Payer: BC Managed Care – PPO | Admitting: Family Medicine

## 2019-01-29 ENCOUNTER — Encounter: Payer: Self-pay | Admitting: Family Medicine

## 2019-01-29 VITALS — BP 122/80 | HR 77 | Temp 98.9°F | Resp 16 | Ht 63.0 in | Wt 176.0 lb

## 2019-01-29 DIAGNOSIS — E890 Postprocedural hypothyroidism: Secondary | ICD-10-CM | POA: Diagnosis not present

## 2019-01-29 DIAGNOSIS — E8940 Asymptomatic postprocedural ovarian failure: Secondary | ICD-10-CM

## 2019-01-29 DIAGNOSIS — R635 Abnormal weight gain: Secondary | ICD-10-CM | POA: Diagnosis not present

## 2019-01-29 DIAGNOSIS — Z720 Tobacco use: Secondary | ICD-10-CM | POA: Diagnosis not present

## 2019-01-29 NOTE — Progress Notes (Signed)
Patient: [name] Female    DOB: January 26, 1976   43 y.o.   MRN: [id number] Visit Date: 01/29/2019  Today's Provider: [name], MD   Chief Complaint  Patient presents with  . Weight Gain   Subjective:   HPI Patient comes in today c/o weight gain. She reports that she has gained at least 20lbs in the last 2 months. She feels that her thyroid levels could be off. She is currently taking levothyroxine 118mcg daily. She is active, working out at least 4 days a week 30-3mins  per day.  She has had surgical menopause from TAH a few years ago. She also mentions that she has stopped smoking since 07/2018. She reports that the Chantix did help.   Wt Readings from Last 3 Encounters:  01/29/19 176 lb (79.8 kg)  07/17/18 164 lb 3.2 oz (74.5 kg)  10/31/17 156 lb (70.8 kg)   BP Readings from Last 3 Encounters:  01/29/19 122/80  07/17/18 118/70  10/31/17 118/76    Allergies  Allergen Reactions  . Allegra [Fexofenadine] Rash     Current Outpatient Medications:  .  cetirizine (ZYRTEC) 10 MG tablet, Take 1 tablet (10 mg total) by mouth daily., Disp: 30 tablet, Rfl: 11 .  levothyroxine (SYNTHROID, LEVOTHROID) 137 MCG tablet, TAKE 1 TABLET (137 MCG TOTAL) BY MOUTH DAILY BEFORE BREAKFAST., Disp: 90 tablet, Rfl: 3 .  loratadine (CLARITIN) 10 MG tablet, Take 10 mg by mouth 2 (two) times daily., Disp: , Rfl:  .  acetic acid 2 % otic solution, Place 4 drops into both ears 3 (three) times daily. (Patient not taking: Reported on 01/29/2019), Disp: 15 mL, Rfl: 0 .  varenicline (CHANTIX CONTINUING MONTH PAK) 1 MG tablet, Take 1 tablet (1 mg total) by mouth 2 (two) times daily. (Patient not taking: Reported on 01/29/2019), Disp: 60 tablet, Rfl: 2 .  varenicline (CHANTIX STARTING MONTH PAK) 0.5 MG X 11 & 1 MG X 42 tablet, Take one 0.5 mg tablet by mouth once daily for 3 days, then increase to one 0.5 mg tablet twice daily for 4 days, then increase to one 1 mg tablet twice daily. (Patient  not taking: Reported on 01/29/2019), Disp: 53 tablet, Rfl: 0  Review of Systems  Constitutional: Positive for unexpected weight change. Negative for activity change, appetite change and fatigue.  Eyes: Negative.   Respiratory: Negative for cough and shortness of breath.   Cardiovascular: Negative for chest pain, palpitations and leg swelling.  Gastrointestinal: Negative.   Endocrine: Negative.   Allergic/Immunologic: Negative for environmental allergies.  Neurological: Negative for dizziness and headaches.  Hematological: Negative.   Psychiatric/Behavioral: Negative for agitation, self-injury, sleep disturbance and suicidal ideas. The patient is not nervous/anxious.     Social History   Tobacco Use  . Smoking status: Former Smoker    Packs/day: 1.00    Years: 10.00    Pack years: 10.00    Types: Cigarettes  . Smokeless tobacco: Never Used  . Tobacco comment: stopped 07/16/2018  Substance Use Topics  . Alcohol use: Yes    Alcohol/week: 6.0 standard drinks    Types: 6 Standard drinks or equivalent per week    Comment: weekends-- 3 to 4      Objective:   BP 122/80   Pulse 77   Temp 98.9 F (37.2 C)   Resp 16   Ht 5\' 3"  (1.6 m)   Wt 176 lb (79.8 kg)   LMP 01/23/2015   SpO2  99%   BMI 31.18 kg/m  Vitals:   01/29/19 0925  BP: 122/80  Pulse: 77  Resp: 16  Temp: 98.9 F (37.2 C)  SpO2: 99%  Weight: 176 lb (79.8 kg)  Height: 5\' 3"  (1.6 m)  Body mass index is 31.18 kg/m.   Physical Exam Vitals signs reviewed.  HENT:     Head: Normocephalic and atraumatic.     Right Ear: External ear normal.     Left Ear: External ear normal.  Eyes:     General: No scleral icterus. Neck:     Musculoskeletal: Neck supple.  Cardiovascular:     Rate and Rhythm: Regular rhythm.     Heart sounds: Normal heart sounds.  Pulmonary:     Breath sounds: Normal breath sounds.  Abdominal:     Palpations: Abdomen is soft.  Skin:    General: Skin is warm and dry.  Neurological:      General: No focal deficit present.     Mental Status: She is alert and oriented to person, place, and time.  Psychiatric:        Mood and Affect: Mood normal.        Behavior: Behavior normal.        Thought Content: Thought content normal.        Judgment: Judgment normal.      No results found for any visits on 01/29/19.     Assessment & Plan    1. Postoperative hypothyroidism S/p thyroidectomy for thyroid cancer. - TSH + free T4  2. Tobacco abuse Pt discontinued March 2020.  3. Surgical menopause  4 Weight Gain She has gained 20 lbs over the past year.Discussed dietary changes that might have occurred since stopping smoking. RTC 2-3 months after trying weight watchers.     Cranford Mon, MD  [facility]

## 2019-01-30 LAB — TSH+FREE T4
Free T4: 1.62 ng/dL (ref 0.82–1.77)
TSH: 1.28 u[IU]/mL (ref 0.450–4.500)

## 2019-06-04 ENCOUNTER — Ambulatory Visit: Payer: BC Managed Care – PPO | Attending: Internal Medicine

## 2019-06-04 DIAGNOSIS — Z20822 Contact with and (suspected) exposure to covid-19: Secondary | ICD-10-CM

## 2019-06-05 LAB — NOVEL CORONAVIRUS, NAA: SARS-CoV-2, NAA: NOT DETECTED

## 2019-07-17 ENCOUNTER — Ambulatory Visit: Payer: Self-pay | Admitting: Endocrinology

## 2019-07-22 ENCOUNTER — Other Ambulatory Visit: Payer: Self-pay | Admitting: Endocrinology

## 2019-07-22 NOTE — Telephone Encounter (Signed)
1.  Please schedule f/u appt 2.  Then please refill x 1, pending that appt.  

## 2019-07-22 NOTE — Telephone Encounter (Signed)
Pt was instructed to f/u in one year. Would you like to see pt before refilling?

## 2019-07-23 NOTE — Progress Notes (Signed)
Patient: [name], Female    DOB: Feb 16, 1976, 44 y.o.   MRN: [id number] Visit Date: 07/24/2019  Today's Provider: [name], MD   Chief Complaint  Patient presents with  . Annual Exam   Subjective:     Annual physical exam [name] is a 44 y.o. female who presents today for health maintenance and complete physical. She feels well. She reports exercising yes/some. She reports she is sleeping well. She is status post partial hysterectomy for uterine fibroids.  No cervical cancer history.  She does not know if she has had a Pap smear since her hysterectomy. -----------------------------------------------------------  Colonoscopy: 01/04/2010 Mammogram: 09/27/2013  Review of Systems  Constitutional: Negative for activity change, appetite change and fatigue.  HENT: Negative.   Eyes: Negative.   Respiratory: Negative for cough and shortness of breath.   Cardiovascular: Negative for chest pain, palpitations and leg swelling.  Gastrointestinal: Negative.   Endocrine: Negative.   Genitourinary: Negative.   Musculoskeletal: Negative.   Allergic/Immunologic: Negative for environmental allergies.  Neurological: Negative for dizziness and headaches.  Hematological: Negative.   Psychiatric/Behavioral: Negative for agitation, self-injury, sleep disturbance and suicidal ideas. The patient is not nervous/anxious.     Social History      She  reports that she has quit smoking. Her smoking use included cigarettes. She has a 10.00 pack-year smoking history. She has never used smokeless tobacco. She reports current alcohol use of about 6.0 standard drinks of alcohol per week. She reports that she does not use drugs.       Social History   Socioeconomic History  . Marital status: Married    Spouse name: Not on file  . Number of children: Not on file  . Years of education: Not on file  . Highest education level: Not on file  Occupational History  . Not on file    Tobacco Use  . Smoking status: Former Smoker    Packs/day: 1.00    Years: 10.00    Pack years: 10.00    Types: Cigarettes  . Smokeless tobacco: Never Used  . Tobacco comment: stopped 07/16/2018  Substance and Sexual Activity  . Alcohol use: Yes    Alcohol/week: 6.0 standard drinks    Types: 6 Standard drinks or equivalent per week    Comment: weekends-- 3 to 4  . Drug use: No  . Sexual activity: Not on file  Other Topics Concern  . Not on file  Social History Narrative  . Not on file   Social Determinants of Health   Financial Resource Strain:   . Difficulty of Paying Living Expenses:   Food Insecurity:   . Worried About Charity fundraiser in the Last Year:   . Arboriculturist in the Last Year:   Transportation Needs:   . Film/video editor (Medical):   Marland Kitchen Lack of Transportation (Non-Medical):   Physical Activity:   . Days of Exercise per Week:   . Minutes of Exercise per Session:   Stress:   . Feeling of Stress :   Social Connections:   . Frequency of Communication with Friends and Family:   . Frequency of Social Gatherings with Friends and Family:   . Attends Religious Services:   . Active Member of Clubs or Organizations:   . Attends Archivist Meetings:   Marland Kitchen Marital Status:     Past Medical History:  Diagnosis Date  . Dysmenorrhea   . History  of goiter    multinodalar thyroid goiter--  s/p  total thyroidectomy---  per path,  Fibrotic adenomatous nodule   . History of hyperthyroidism    RAI tx March 2015  . Hypothyroidism, postradioiodine therapy   . Menorrhagia   . Uterus, adenomyosis      Patient Active Problem List   Diagnosis Date Noted  . Status post laparoscopic assisted vaginal hysterectomy 01/26/2015  . Hypothyroidism 12/31/2014  . S/P total thyroidectomy 08/19/2014  . Dyspnea 07/02/2013  . Thyroid mass 07/02/2013    Past Surgical History:  Procedure Laterality Date  . ABDOMINAL HYSTERECTOMY  2017  . CESAREAN SECTION   07-17-2006  . LAPAROSCOPIC TUBAL LIGATION  08-23-2006   w/  fulgeration billaterlly  . THYROIDECTOMY N/A 08/19/2014   Procedure: TOTAL THYROIDECTOMY;  Surgeon: [name], MD;  Location: [facility];  Service: General;  Laterality: N/A;    Family History        Family Status  Relation Name Status  . Father  Deceased  . MGM  (Not Specified)  . Mother  Alive  . Brother  Alive  . PGM  (Not Specified)        Her family history includes Cancer in her maternal grandmother; Colon cancer in her paternal grandmother; Diabetes in her father; Heart disease in her father; Hypertension in her father; Ulcerative colitis in her paternal grandmother.      Allergies  Allergen Reactions  . Allegra [Fexofenadine] Rash     Current Outpatient Medications:  .  cetirizine (ZYRTEC) 10 MG tablet, Take 1 tablet (10 mg total) by mouth daily., Disp: 30 tablet, Rfl: 11 .  levothyroxine (SYNTHROID, LEVOTHROID) 137 MCG tablet, TAKE 1 TABLET (137 MCG TOTAL) BY MOUTH DAILY BEFORE BREAKFAST., Disp: 90 tablet, Rfl: 3 .  loratadine (CLARITIN) 10 MG tablet, Take 10 mg by mouth 2 (two) times daily., Disp: , Rfl:  .  acetic acid 2 % otic solution, Place 4 drops into both ears 3 (three) times daily. (Patient not taking: Reported on 01/29/2019), Disp: 15 mL, Rfl: 0 .  varenicline (CHANTIX CONTINUING MONTH PAK) 1 MG tablet, Take 1 tablet (1 mg total) by mouth 2 (two) times daily. (Patient not taking: Reported on 01/29/2019), Disp: 60 tablet, Rfl: 2 .  varenicline (CHANTIX STARTING MONTH PAK) 0.5 MG X 11 & 1 MG X 42 tablet, Take one 0.5 mg tablet by mouth once daily for 3 days, then increase to one 0.5 mg tablet twice daily for 4 days, then increase to one 1 mg tablet twice daily. (Patient not taking: Reported on 01/29/2019), Disp: 53 tablet, Rfl: 0   Patient Care Team: Jerrol Banana., MD as PCP - General (Family Medicine)    Objective:    Vitals: BP 119/85 (BP Location: Left Arm, Patient Position: Sitting, Cuff  Size: Large)   Pulse 95   Temp (!) 97.3 F (36.3 C) (Other (Comment))   Resp 16   Ht 5\' 3"  (1.6 m)   Wt 173 lb (78.5 kg)   LMP 01/23/2015   SpO2 98%   BMI 30.65 kg/m    Vitals:   07/24/19 1403  BP: 119/85  Pulse: 95  Resp: 16  Temp: (!) 97.3 F (36.3 C)  TempSrc: Other (Comment)  SpO2: 98%  Weight: 173 lb (78.5 kg)  Height: 5\' 3"  (1.6 m)     Physical Exam Vitals reviewed.  HENT:     Head: Normocephalic and atraumatic.     Right Ear: External ear normal.  Left Ear: External ear normal.  Eyes:     General: No scleral icterus. Cardiovascular:     Rate and Rhythm: Regular rhythm.     Heart sounds: Normal heart sounds.  Pulmonary:     Breath sounds: Normal breath sounds.  Abdominal:     Palpations: Abdomen is soft.  Musculoskeletal:     Cervical back: Neck supple.  Skin:    General: Skin is warm and dry.  Neurological:     General: No focal deficit present.     Mental Status: She is alert and oriented to person, place, and time.  Psychiatric:        Mood and Affect: Mood normal.        Behavior: Behavior normal.        Thought Content: Thought content normal.        Judgment: Judgment normal.      Depression Screen PHQ 2/9 Scores 07/24/2019 10/23/2017 12/06/2016  PHQ - 2 Score 0 0 0  PHQ- 9 Score 1 - 0       Assessment & Plan:     Routine Health Maintenance and Physical Exam  Exercise Activities and Dietary recommendations Goals   None     Immunization History  Administered Date(s) Administered  . Tdap 08/23/2013    Health Maintenance  Topic Date Due  . HIV Screening  Never done  . PAP SMEAR-Modifier  01/19/2018  . INFLUENZA VACCINE  Never done  . TETANUS/TDAP  08/24/2023     Discussed health benefits of physical activity, and encouraged her to engage in regular exercise appropriate for her age and condition.    --------------------------------------------------------------------  1. Annual physical exam  - Lipid panel -  TSH - Comprehensive Metabolic Panel (CMET) - CBC w/Diff/Platelet - Cytology - PAP  2. Postoperative hypothyroidism TSH of less than 2. - Lipid panel - TSH - Comprehensive Metabolic Panel (CMET) - CBC w/Diff/Platelet - levothyroxine (SYNTHROID) 137 MCG tablet; Take 1 tablet (137 mcg total) by mouth daily before breakfast.  Dispense: 90 tablet; Refill: 3  3. Screening for cervical cancer Baseline Pap. No  History of cervical cancer so would not need future Pap smears. - Cytology - PAP  Follow up in one year for CPE.    I,Verline Kong,acting as a scribe for [name], MD.,have documented all relevant documentation on the behalf of [name], MD,as directed by  [name], MD while in the presence of [name], MD.    [name], MD  [facility]

## 2019-07-24 ENCOUNTER — Ambulatory Visit (INDEPENDENT_AMBULATORY_CARE_PROVIDER_SITE_OTHER): Payer: BC Managed Care – PPO | Admitting: Family Medicine

## 2019-07-24 ENCOUNTER — Encounter: Payer: Self-pay | Admitting: Family Medicine

## 2019-07-24 ENCOUNTER — Other Ambulatory Visit (HOSPITAL_COMMUNITY)
Admission: RE | Admit: 2019-07-24 | Discharge: 2019-07-24 | Disposition: A | Discharge status: Home / Self Care | Payer: BC Managed Care – PPO | Source: Ambulatory Visit | Attending: Family Medicine | Admitting: Family Medicine

## 2019-07-24 ENCOUNTER — Other Ambulatory Visit: Payer: Self-pay

## 2019-07-24 VITALS — BP 119/85 | HR 95 | Temp 97.3°F | Resp 16 | Ht 63.0 in | Wt 173.0 lb

## 2019-07-24 DIAGNOSIS — E890 Postprocedural hypothyroidism: Secondary | ICD-10-CM | POA: Diagnosis not present

## 2019-07-24 DIAGNOSIS — Z0000 Encounter for general adult medical examination without abnormal findings: Secondary | ICD-10-CM | POA: Insufficient documentation

## 2019-07-24 DIAGNOSIS — Z124 Encounter for screening for malignant neoplasm of cervix: Secondary | ICD-10-CM | POA: Diagnosis not present

## 2019-07-24 MED ORDER — LEVOTHYROXINE SODIUM 137 MCG PO TABS: 137.0000 ug | ORAL_TABLET | Freq: Every day | ORAL | 3 refills | Status: DC

## 2019-07-26 LAB — CYTOLOGY - PAP: Diagnosis: NEGATIVE

## 2019-07-29 ENCOUNTER — Telehealth: Payer: Self-pay

## 2019-07-29 NOTE — Telephone Encounter (Signed)
Patient advised and also viewed labs on mychart.

## 2019-07-29 NOTE — Telephone Encounter (Signed)
-----   Message from Jerrol Banana., MD sent at 07/26/2019 11:26 AM EDT ----- Normal Pap.

## 2020-05-13 ENCOUNTER — Encounter: Payer: Self-pay | Admitting: Physician Assistant

## 2020-05-13 ENCOUNTER — Telehealth (INDEPENDENT_AMBULATORY_CARE_PROVIDER_SITE_OTHER): Payer: BC Managed Care – PPO | Admitting: Physician Assistant

## 2020-05-13 DIAGNOSIS — J0140 Acute pansinusitis, unspecified: Secondary | ICD-10-CM

## 2020-05-13 DIAGNOSIS — T3695XA Adverse effect of unspecified systemic antibiotic, initial encounter: Secondary | ICD-10-CM

## 2020-05-13 DIAGNOSIS — R197 Diarrhea, unspecified: Secondary | ICD-10-CM

## 2020-05-13 DIAGNOSIS — R059 Cough, unspecified: Secondary | ICD-10-CM

## 2020-05-13 DIAGNOSIS — B379 Candidiasis, unspecified: Secondary | ICD-10-CM

## 2020-05-13 MED ORDER — BENZONATATE 200 MG PO CAPS: 200.0000 mg | ORAL_CAPSULE | Freq: Three times a day (TID) | ORAL | 0 refills | Status: DC | PRN

## 2020-05-13 MED ORDER — FLUCONAZOLE 150 MG PO TABS: 150.0000 mg | ORAL_TABLET | Freq: Once | ORAL | 0 refills | Status: AC

## 2020-05-13 MED ORDER — AMOXICILLIN-POT CLAVULANATE 875-125 MG PO TABS: 1.0000 | ORAL_TABLET | Freq: Two times a day (BID) | ORAL | 0 refills | Status: DC

## 2020-05-13 NOTE — Progress Notes (Signed)
MyChart Video Visit    Virtual Visit via Video Note   This visit type was conducted due to national recommendations for restrictions regarding the COVID-19 Pandemic (e.g. social distancing) in an effort to limit this patient's exposure and mitigate transmission in our community. This patient is at least at moderate risk for complications without adequate follow up. This format is felt to be most appropriate for this patient at this time. Physical exam was limited by quality of the video and audio technology used for the visit.   Patient location: Home Provider location: Home office in [place], [place]  I discussed the limitations of evaluation and management by telemedicine and the availability of in person appointments. The patient expressed understanding and agreed to proceed.  Patient: [name]   DOB: 1975-10-21   45 y.o. Female  MRN: [id number] Visit Date: 05/13/2020  Today's healthcare provider: Mar Daring, PA-C   No chief complaint on file.  Subjective    URI  This is a new problem. The current episode started 1 to 4 weeks ago (going into 3rd week). The problem has been gradually worsening (had started to feel better and symptoms started back on Monday). There has been no fever. Associated symptoms include congestion, coughing, diarrhea (loose watery; occurring often), headaches, a plugged ear sensation, rhinorrhea, sinus pain, sneezing and a sore throat (comes and goes). Pertinent negatives include no abdominal pain, chest pain, ear pain, nausea or swollen glands. She has tried decongestant (alka seltzer cold, tylenol cold, thera-flu) for the symptoms. The treatment provided moderate relief.    She is taking an at home covid 19 rapid test this evening. Will update with results.   Patient Active Problem List   Diagnosis Date Noted  . Status post laparoscopic assisted vaginal hysterectomy 01/26/2015  . Hypothyroidism 12/31/2014  . S/P total thyroidectomy  08/19/2014  . Dyspnea 07/02/2013  . Thyroid mass 07/02/2013   Past Medical History:  Diagnosis Date  . Dysmenorrhea   . History of goiter    multinodalar thyroid goiter--  s/p  total thyroidectomy---  per path,  Fibrotic adenomatous nodule   . History of hyperthyroidism    RAI tx March 2015  . Hypothyroidism, postradioiodine therapy   . Menorrhagia   . Uterus, adenomyosis       Medications: Outpatient Medications Prior to Visit  Medication Sig  . acetic acid 2 % otic solution Place 4 drops into both ears 3 (three) times daily. (Patient not taking: Reported on 01/29/2019)  . cetirizine (ZYRTEC) 10 MG tablet Take 1 tablet (10 mg total) by mouth daily.  Marland Kitchen levothyroxine (SYNTHROID) 137 MCG tablet Take 1 tablet (137 mcg total) by mouth daily before breakfast.  . loratadine (CLARITIN) 10 MG tablet Take 10 mg by mouth 2 (two) times daily.  . varenicline (CHANTIX CONTINUING MONTH PAK) 1 MG tablet Take 1 tablet (1 mg total) by mouth 2 (two) times daily. (Patient not taking: Reported on 01/29/2019)  . varenicline (CHANTIX STARTING MONTH PAK) 0.5 MG X 11 & 1 MG X 42 tablet Take one 0.5 mg tablet by mouth once daily for 3 days, then increase to one 0.5 mg tablet twice daily for 4 days, then increase to one 1 mg tablet twice daily. (Patient not taking: Reported on 01/29/2019)   No facility-administered medications prior to visit.    Review of Systems  Constitutional: Positive for chills. Negative for fatigue and fever.  HENT: Positive for congestion, rhinorrhea, sinus pain, sneezing and sore throat (comes  and goes). Negative for ear pain.   Respiratory: Positive for cough. Negative for chest tightness.   Cardiovascular: Negative for chest pain.  Gastrointestinal: Positive for diarrhea (loose watery; occurring often). Negative for abdominal pain and nausea.  Musculoskeletal: Negative for arthralgias and myalgias.  Neurological: Positive for headaches.    Last CBC Lab Results  Component Value  Date   WBC 10.8 12/20/2016   HGB 14.7 12/20/2016   HCT 42.4 12/20/2016   MCV 93 12/20/2016   MCH 32.2 12/20/2016   RDW 13.0 12/20/2016   PLT 272 12/20/2016   Last metabolic panel Lab Results  Component Value Date   GLUCOSE 91 12/20/2016   NA 141 12/20/2016   K 4.0 12/20/2016   CL 104 12/20/2016   CO2 23 12/20/2016   BUN 8 12/20/2016   CREATININE 0.75 12/20/2016   GFRNONAA 99 12/20/2016   GFRAA 114 12/20/2016   CALCIUM 9.2 12/20/2016   PROT 6.8 12/20/2016   ALBUMIN 4.4 12/20/2016   LABGLOB 2.4 12/20/2016   AGRATIO 1.8 12/20/2016   BILITOT 0.3 12/20/2016   ALKPHOS 64 12/20/2016   AST 20 12/20/2016   ALT 14 12/20/2016      Objective    LMP 01/23/2015  BP Readings from Last 3 Encounters:  07/24/19 119/85  01/29/19 122/80  07/17/18 118/70   Wt Readings from Last 3 Encounters:  07/24/19 173 lb (78.5 kg)  01/29/19 176 lb (79.8 kg)  07/17/18 164 lb 3.2 oz (74.5 kg)      Physical Exam     Assessment & Plan     1. Acute non-recurrent pansinusitis Worsening symptoms that have not responded to OTC medications. Will give augmentin as below. Continue allergy medications. Stay well hydrated and get plenty of rest. Call if no symptom improvement or if symptoms worsen. - amoxicillin-clavulanate (AUGMENTIN) 875-125 MG tablet; Take 1 tablet by mouth 2 (two) times daily.  Dispense: 20 tablet; Refill: 0  2. Antibiotic-induced yeast infection Gets yeast infections with antibiotics. Diflucan given as below.  - fluconazole (DIFLUCAN) 150 MG tablet; Take 1 tablet (150 mg total) by mouth once for 1 dose. May take in 48-72 hrs if needed  Dispense: 2 tablet; Refill: 0  3. Cough Tessalon cough for cough.  - benzonatate (TESSALON) 200 MG capsule; Take 1 capsule (200 mg total) by mouth 3 (three) times daily as needed.  Dispense: 30 capsule; Refill: 0  4. Diarrhea, unspecified type Advised to try Imodium OTC. Push fluids. May benefit by adding gatorade zero or pedialyte.   No  follow-ups on file.     I discussed the assessment and treatment plan with the patient. The patient was provided an opportunity to ask questions and all were answered. The patient agreed with the plan and demonstrated an understanding of the instructions.   The patient was advised to call back or seek an in-person evaluation if the symptoms worsen or if the condition fails to improve as anticipated.  I provided 14 minutes of face-to-face time during this encounter via MyChart Video enabled encounter.  Delmer Islam, PA-C, have reviewed all documentation for this visit. The documentation on 05/13/20 for the exam, diagnosis, procedures, and orders are all accurate and complete.  [facility] [phone] (phone) [phone] (fax)  [facility]

## 2020-07-06 ENCOUNTER — Other Ambulatory Visit: Payer: Self-pay | Admitting: Family Medicine

## 2020-07-06 DIAGNOSIS — E890 Postprocedural hypothyroidism: Secondary | ICD-10-CM

## 2020-07-27 ENCOUNTER — Encounter: Payer: Self-pay | Admitting: Family Medicine

## 2020-07-27 NOTE — Progress Notes (Deleted)
Complete physical exam   Patient: [name]   DOB: 12-18-1975   46 y.o. Female  MRN: [id number] Visit Date: 07/27/2020  Today's healthcare provider: [name], MD   No chief complaint on file.  Subjective    [name] is a 45 y.o. female who presents today for a complete physical exam.  She reports consuming a {diet types:17450} diet. {Exercise:19826} She generally feels {well/fairly well/poorly:18703}. She reports sleeping {well/fairly well/poorly:18703}. She {does/does not:200015} have additional problems to discuss today.  HPI  ***  Past Medical History:  Diagnosis Date  . Dysmenorrhea   . History of goiter    multinodalar thyroid goiter--  s/p  total thyroidectomy---  per path,  Fibrotic adenomatous nodule   . History of hyperthyroidism    RAI tx March 2015  . Hypothyroidism, postradioiodine therapy   . Menorrhagia   . Uterus, adenomyosis    Past Surgical History:  Procedure Laterality Date  . ABDOMINAL HYSTERECTOMY  2017  . CESAREAN SECTION  07-17-2006  . LAPAROSCOPIC TUBAL LIGATION  08-23-2006   w/  fulgeration billaterlly  . THYROIDECTOMY N/A 08/19/2014   Procedure: TOTAL THYROIDECTOMY;  Surgeon: [name], MD;  Location: [facility];  Service: General;  Laterality: N/A;   Social History   Socioeconomic History  . Marital status: Married    Spouse name: Not on file  . Number of children: Not on file  . Years of education: Not on file  . Highest education level: Not on file  Occupational History  . Not on file  Tobacco Use  . Smoking status: Former Smoker    Packs/day: 1.00    Years: 10.00    Pack years: 10.00    Types: Cigarettes  . Smokeless tobacco: Never Used  . Tobacco comment: stopped 07/16/2018  Vaping Use  . Vaping Use: Never used  Substance and Sexual Activity  . Alcohol use: Yes    Alcohol/week: 6.0 standard drinks    Types: 6 Standard drinks or equivalent per week    Comment: weekends-- 3 to 4  . Drug use: No  .  Sexual activity: Not on file  Other Topics Concern  . Not on file  Social History Narrative  . Not on file   Social Determinants of Health   Financial Resource Strain: Not on file  Food Insecurity: Not on file  Transportation Needs: Not on file  Physical Activity: Not on file  Stress: Not on file  Social Connections: Not on file  Intimate Partner Violence: Not on file   Family Status  Relation Name Status  . Father  Deceased  . MGM  (Not Specified)  . Mother  Alive  . Brother  Alive  . PGM  (Not Specified)   Family History  Problem Relation Age of Onset  . Diabetes Father   . Heart disease Father   . Hypertension Father   . Cancer Maternal Grandmother   . Colon cancer Paternal Grandmother   . Ulcerative colitis Paternal Grandmother    Allergies  Allergen Reactions  . Allegra [Fexofenadine] Rash    Patient Care Team: Jerrol Banana., MD as PCP - General (Family Medicine)   Medications: Outpatient Medications Prior to Visit  Medication Sig  . amoxicillin-clavulanate (AUGMENTIN) 875-125 MG tablet Take 1 tablet by mouth 2 (two) times daily.  . benzonatate (TESSALON) 200 MG capsule Take 1 capsule (200 mg total) by mouth 3 (three) times daily as needed.  . cetirizine (ZYRTEC) 10 MG  tablet Take 1 tablet (10 mg total) by mouth daily.  Marland Kitchen levothyroxine (SYNTHROID) 137 MCG tablet TAKE 1 TABLET (137 MCG TOTAL) BY MOUTH DAILY BEFORE BREAKFAST.  Marland Kitchen loratadine (CLARITIN) 10 MG tablet Take 10 mg by mouth 2 (two) times daily.   No facility-administered medications prior to visit.    Review of Systems  {Labs  Heme  Chem  Endocrine  Serology  Results Review (optional):23779::" "}  Objective    LMP 01/23/2015  {Show previous vital signs (optional):23777::" "}  Physical Exam  ***  Last depression screening scores PHQ 2/9 Scores 07/24/2019 10/23/2017 12/06/2016  PHQ - 2 Score 0 0 0  PHQ- 9 Score 1 - 0   Last fall risk screening Fall Risk  10/23/2017  Falls in  the past year? No   Last Audit-C alcohol use screening Alcohol Use Disorder Test (AUDIT) 07/24/2019  1. How often do you have a drink containing alcohol? 2  2. How many drinks containing alcohol do you have on a typical day when you are drinking? 1  3. How often do you have six or more drinks on one occasion? 2  AUDIT-C Score 5   A score of 3 or more in women, and 4 or more in men indicates increased risk for alcohol abuse, EXCEPT if all of the points are from question 1   No results found for any visits on 07/27/20.  Assessment & Plan    Routine Health Maintenance and Physical Exam  Exercise Activities and Dietary recommendations Goals   None     Immunization History  Administered Date(s) Administered  . Tdap 08/23/2013    Health Maintenance  Topic Date Due  . Hepatitis C Screening  Never done  . COVID-19 Vaccine (1) Never done  . HIV Screening  Never done  . INFLUENZA VACCINE  Never done  . PAP SMEAR-Modifier  07/24/2022  . TETANUS/TDAP  08/24/2023  . HPV VACCINES  Aged Out    Discussed health benefits of physical activity, and encouraged her to engage in regular exercise appropriate for her age and condition.  ***  No follow-ups on file.     {provider attestation***:1}   [name], MD  [facility] [phone] (phone) [phone] (fax)  [place]

## 2020-09-16 DIAGNOSIS — M7711 Lateral epicondylitis, right elbow: Secondary | ICD-10-CM | POA: Diagnosis not present

## 2020-09-30 DIAGNOSIS — M7711 Lateral epicondylitis, right elbow: Secondary | ICD-10-CM | POA: Diagnosis not present

## 2020-10-07 ENCOUNTER — Other Ambulatory Visit: Payer: Self-pay | Admitting: Family Medicine

## 2020-10-07 DIAGNOSIS — E890 Postprocedural hypothyroidism: Secondary | ICD-10-CM

## 2020-10-07 NOTE — Telephone Encounter (Signed)
Courtesy refill. Patient needs labs for further refills. Requested Prescriptions  Pending Prescriptions Disp Refills  . levothyroxine (SYNTHROID) 137 MCG tablet [Pharmacy Med Name: LEVOTHYROXINE 137 MCG TABLET] 30 tablet 0    Sig: TAKE 1 TABLET BY MOUTH DAILY BEFORE BREAKFAST.     Endocrinology:  Hypothyroid Agents Failed - 10/07/2020  1:44 AM      Failed - TSH needs to be rechecked within 3 months after an abnormal result. Refill until TSH is due.      Failed - TSH in normal range and within 360 days    TSH  Date Value Ref Range Status  01/29/2019 1.280 0.450 - 4.500 uIU/mL Final         Passed - Valid encounter within last 12 months    Recent Outpatient Visits          4 months ago Acute non-recurrent pansinusitis   [place], Clearnce Sorrel, [place]   1 year ago Annual physical exam   [facility] Jerrol Banana., MD   1 year ago Postoperative hypothyroidism   [facility] Jerrol Banana., MD   2 years ago Non-recurrent acute serous otitis media of left ear   [facility] Jerrol Banana., MD   2 years ago Otalgia of left ear   [facility] at [facility], Arlie Solomons, MD

## 2020-11-13 ENCOUNTER — Other Ambulatory Visit: Payer: Self-pay | Admitting: Family Medicine

## 2020-11-13 DIAGNOSIS — E890 Postprocedural hypothyroidism: Secondary | ICD-10-CM

## 2020-11-13 NOTE — Telephone Encounter (Signed)
  Notes to clinic:  courtesy refill has been given No appt scheduled  Message sent to patient    Requested Prescriptions  Pending Prescriptions Disp Refills   levothyroxine (SYNTHROID) 137 MCG tablet [Pharmacy Med Name: LEVOTHYROXINE 137 MCG TABLET] 30 tablet 0    Sig: TAKE 1 TABLET BY MOUTH EVERY [place]      Endocrinology:  Hypothyroid Agents Failed - 11/13/2020 11:31 AM      Failed - TSH needs to be rechecked within 3 months after an abnormal result. Refill until TSH is due.      Failed - TSH in normal range and within 360 days    TSH  Date Value Ref Range Status  01/29/2019 1.280 0.450 - 4.500 uIU/mL Final          Passed - Valid encounter within last 12 months    Recent Outpatient Visits           6 months ago Acute non-recurrent pansinusitis   [place], Clearnce Sorrel, [place]   1 year ago Annual physical exam   [facility] Jerrol Banana., MD   1 year ago Postoperative hypothyroidism   [facility] Jerrol Banana., MD   3 years ago Non-recurrent acute serous otitis media of left ear   [facility] Jerrol Banana., MD   3 years ago Otalgia of left ear   [facility] at [facility], Arlie Solomons, MD

## 2020-12-21 ENCOUNTER — Other Ambulatory Visit: Payer: Self-pay | Admitting: Family Medicine

## 2020-12-21 DIAGNOSIS — E890 Postprocedural hypothyroidism: Secondary | ICD-10-CM

## 2020-12-21 NOTE — Telephone Encounter (Signed)
   Last refill:  11/13/2020  Future visit scheduled: no  Notes to clinic:  courtesy refill given Several attempts have been made to contact patient    Requested Prescriptions  Pending Prescriptions Disp Refills   levothyroxine (SYNTHROID) 137 MCG tablet [Pharmacy Med Name: LEVOTHYROXINE 137 MCG TABLET] 30 tablet 0    Sig: TAKE 1 TABLET BY MOUTH EVERY DAY BEFORE BREAKFAST (NEED OFFICE VISIT)     Endocrinology:  Hypothyroid Agents Failed - 12/21/2020  1:54 AM      Failed - TSH needs to be rechecked within 3 months after an abnormal result. Refill until TSH is due.      Failed - TSH in normal range and within 360 days    TSH  Date Value Ref Range Status  01/29/2019 1.280 0.450 - 4.500 uIU/mL Final          Passed - Valid encounter within last 12 months    Recent Outpatient Visits           7 months ago Acute non-recurrent pansinusitis   [place], Clearnce Sorrel, [place]   1 year ago Annual physical exam   [facility] Jerrol Banana., MD   1 year ago Postoperative hypothyroidism   [facility] Jerrol Banana., MD   3 years ago Non-recurrent acute serous otitis media of left ear   [facility] Jerrol Banana., MD   3 years ago Otalgia of left ear   Primary Care at [facility], Arlie Solomons, MD

## 2020-12-28 ENCOUNTER — Other Ambulatory Visit: Payer: Self-pay | Admitting: Family Medicine

## 2020-12-28 DIAGNOSIS — E890 Postprocedural hypothyroidism: Secondary | ICD-10-CM

## 2020-12-30 MED ORDER — LEVOTHYROXINE SODIUM 137 MCG PO TABS: 137.0000 ug | ORAL_TABLET | Freq: Every day | ORAL | 0 refills | Status: DC

## 2020-12-30 NOTE — Telephone Encounter (Signed)
Please review. Patient has not been seen since 07/2019. She has a f/u on 01/06/21.

## 2021-01-06 ENCOUNTER — Other Ambulatory Visit: Payer: Self-pay

## 2021-01-06 ENCOUNTER — Ambulatory Visit: Payer: BC Managed Care – PPO | Admitting: Family Medicine

## 2021-01-06 ENCOUNTER — Encounter: Payer: Self-pay | Admitting: Family Medicine

## 2021-01-06 VITALS — BP 139/94 | HR 81 | Resp 16 | Ht 63.0 in | Wt 167.0 lb

## 2021-01-06 DIAGNOSIS — I10 Essential (primary) hypertension: Secondary | ICD-10-CM | POA: Diagnosis not present

## 2021-01-06 DIAGNOSIS — E7849 Other hyperlipidemia: Secondary | ICD-10-CM | POA: Diagnosis not present

## 2021-01-06 DIAGNOSIS — E039 Hypothyroidism, unspecified: Secondary | ICD-10-CM

## 2021-01-06 DIAGNOSIS — J309 Allergic rhinitis, unspecified: Secondary | ICD-10-CM | POA: Diagnosis not present

## 2021-01-06 NOTE — Progress Notes (Signed)
I,[name],acting as a scribe for Connie Durie, MD.,have documented all relevant documentation on the behalf of Connie Durie, MD,as directed by  Connie Durie, MD while in the presence of Connie Durie, MD.   Established patient visit   Patient: [name]   DOB: [birth year]   45 y.o. Female  MRN: [id number] Visit Date: 01/06/2021  Today's healthcare provider: [name], MD   Chief Complaint  Patient presents with   Follow-up   Hypothyroidism   Subjective  -------------------------------------------------------------------------------------------------------------------- HPI  Patient comes in today for follow-up.  She feels well.  She is status post hysterectomy.  She did quit smoking in 2020.  She has no complaints today. Hypothyroid, follow-up  Lab Results  Component Value Date   TSH 1.280 01/29/2019   TSH 1.92 07/17/2018   TSH 2.410 12/20/2016   FREET4 1.62 01/29/2019   FREET4 0.93 07/17/2018   Wt Readings from Last 3 Encounters:  01/06/21 167 lb (75.8 kg)  07/24/19 173 lb (78.5 kg)  01/29/19 176 lb (79.8 kg)    She was last seen for hypothyroid 01/29/2019.  Management since that visit includes; on levothyroxine 137 mcg. She reports fair compliance with treatment. She is not having side effects. none  -----------------------------------------------------------------------------------------      Medications: Outpatient Medications Prior to Visit  Medication Sig   levothyroxine (SYNTHROID) 137 MCG tablet Take 1 tablet (137 mcg total) by mouth daily before breakfast. Please schedule an office visit before anymore refills.   loratadine (CLARITIN) 10 MG tablet Take 10 mg by mouth 2 (two) times daily.   [DISCONTINUED] amoxicillin-clavulanate (AUGMENTIN) 875-125 MG tablet Take 1 tablet by mouth 2 (two) times daily.   [DISCONTINUED] benzonatate (TESSALON) 200 MG capsule Take 1 capsule (200 mg total) by mouth 3 (three) times  daily as needed.   [DISCONTINUED] cetirizine (ZYRTEC) 10 MG tablet Take 1 tablet (10 mg total) by mouth daily. (Patient not taking: Reported on 01/06/2021)   No facility-administered medications prior to visit.    Review of Systems  Constitutional:  Negative for appetite change, chills, fatigue and fever.  Respiratory:  Negative for chest tightness and shortness of breath.   Cardiovascular:  Negative for chest pain and palpitations.  Gastrointestinal:  Negative for abdominal pain, nausea and vomiting.  Neurological:  Negative for dizziness and weakness.       Objective  -------------------------------------------------------------------------------------------------------------------- BP (!) 139/94 (BP Location: Right Arm, Patient Position: Sitting, Cuff Size: Large)   Pulse 81   Resp 16   Ht '5\' 3"'$  (1.6 m)   Wt 167 lb (75.8 kg)   LMP 01/23/2015   SpO2 97%   BMI 29.58 kg/m  Physical Exam Vitals reviewed.  HENT:     Head: Normocephalic and atraumatic.     Right Ear: External ear normal.     Left Ear: External ear normal.  Eyes:     General: No scleral icterus. Cardiovascular:     Rate and Rhythm: Regular rhythm.     Heart sounds: Normal heart sounds.  Pulmonary:     Breath sounds: Normal breath sounds.  Abdominal:     Palpations: Abdomen is soft.  Musculoskeletal:     Cervical back: Neck supple.  Skin:    General: Skin is warm and dry.  Neurological:     General: No focal deficit present.     Mental Status: She is alert and oriented to person, place, and time.  Psychiatric:  Mood and Affect: Mood normal.        Behavior: Behavior normal.        Thought Content: Thought content normal.        Judgment: Judgment normal.      No results found for any visits on 01/06/21.  Assessment & Plan  ---------------------------------------------------------------------------------------------------------------------- 1. Hypothyroidism, unspecified type Liver euthyroid  TSH.  Physical in 2023 - Lipid panel - TSH - CBC w/Diff/Platelet - Comprehensive Metabolic Panel (CMET)  2. Elevated blood pressure reading with diagnosis of hypertension Recheck home blood pressures with good cuff. - Lipid panel - TSH - CBC w/Diff/Platelet - Comprehensive Metabolic Panel (CMET)  3. Other hyperlipidemia  - Lipid panel - TSH - CBC w/Diff/Platelet - Comprehensive Metabolic Panel (CMET)  4. Allergic rhinitis, unspecified seasonality, unspecified trigger  - Lipid panel - TSH - CBC w/Diff/Platelet - Comprehensive Metabolic Panel (CMET)    Return in about 1 year (around 01/06/2022).      I, Connie Durie, MD, have reviewed all documentation for this visit. The documentation on 01/10/21 for the exam, diagnosis, procedures, and orders are all accurate and complete.    Sharleen Szczesny Cranford Mon, MD  [facility] [phone] (phone) [phone] (fax)  [place]

## 2021-01-06 NOTE — Patient Instructions (Signed)
Get a [employer] or [employer] blood pressure cuff to check your blood pressures at home.

## 2021-01-07 LAB — CBC WITH DIFFERENTIAL/PLATELET
Basophils Absolute: 0.1 10*3/uL (ref 0.0–0.2)
Basos: 1 %
EOS (ABSOLUTE): 0.4 10*3/uL (ref 0.0–0.4)
Eos: 5 %
Hematocrit: 43.6 % (ref 34.0–46.6)
Hemoglobin: 15.2 g/dL (ref 11.1–15.9)
Immature Grans (Abs): 0 10*3/uL (ref 0.0–0.1)
Immature Granulocytes: 0 %
Lymphocytes Absolute: 1.5 10*3/uL (ref 0.7–3.1)
Lymphs: 21 %
MCH: 32.1 pg (ref 26.6–33.0)
MCHC: 34.9 g/dL (ref 31.5–35.7)
MCV: 92 fL (ref 79–97)
Monocytes Absolute: 0.5 10*3/uL (ref 0.1–0.9)
Monocytes: 6 %
Neutrophils Absolute: 5 10*3/uL (ref 1.4–7.0)
Neutrophils: 67 %
Platelets: 255 10*3/uL (ref 150–450)
RBC: 4.74 x10E6/uL (ref 3.77–5.28)
RDW: 11.5 % — ABNORMAL LOW (ref 11.7–15.4)
WBC: 7.4 10*3/uL (ref 3.4–10.8)

## 2021-01-07 LAB — COMPREHENSIVE METABOLIC PANEL
ALT: 20 IU/L (ref 0–32)
AST: 20 IU/L (ref 0–40)
Albumin/Globulin Ratio: 2.1 (ref 1.2–2.2)
Albumin: 4.5 g/dL (ref 3.8–4.8)
Alkaline Phosphatase: 77 IU/L (ref 44–121)
BUN/Creatinine Ratio: 11 (ref 9–23)
BUN: 7 mg/dL (ref 6–24)
Bilirubin Total: 0.3 mg/dL (ref 0.0–1.2)
CO2: 22 mmol/L (ref 20–29)
Calcium: 9.3 mg/dL (ref 8.7–10.2)
Chloride: 103 mmol/L (ref 96–106)
Creatinine, Ser: 0.61 mg/dL (ref 0.57–1.00)
Globulin, Total: 2.1 g/dL (ref 1.5–4.5)
Glucose: 90 mg/dL (ref 65–99)
Potassium: 4.2 mmol/L (ref 3.5–5.2)
Sodium: 140 mmol/L (ref 134–144)
Total Protein: 6.6 g/dL (ref 6.0–8.5)
eGFR: 112 mL/min/{1.73_m2} (ref >59)

## 2021-01-07 LAB — LIPID PANEL
Chol/HDL Ratio: 4.7 ratio — ABNORMAL HIGH (ref 0.0–4.4)
Cholesterol, Total: 226 mg/dL — ABNORMAL HIGH (ref 100–199)
HDL: 48 mg/dL (ref >39)
LDL Chol Calc (NIH): 140 mg/dL — ABNORMAL HIGH (ref 0–99)
Triglycerides: 212 mg/dL — ABNORMAL HIGH (ref 0–149)
VLDL Cholesterol Cal: 38 mg/dL (ref 5–40)

## 2021-01-07 LAB — TSH: TSH: 0.334 u[IU]/mL — ABNORMAL LOW (ref 0.450–4.500)

## 2021-01-12 ENCOUNTER — Other Ambulatory Visit: Payer: Self-pay | Admitting: *Deleted

## 2021-01-12 DIAGNOSIS — E039 Hypothyroidism, unspecified: Secondary | ICD-10-CM

## 2021-01-12 MED ORDER — LEVOTHYROXINE SODIUM 125 MCG PO TABS: 125.0000 ug | ORAL_TABLET | Freq: Every day | ORAL | 1 refills | Status: DC

## 2021-01-22 DIAGNOSIS — S93491A Sprain of other ligament of right ankle, initial encounter: Secondary | ICD-10-CM | POA: Diagnosis not present

## 2021-01-25 ENCOUNTER — Other Ambulatory Visit: Payer: Self-pay | Admitting: Family Medicine

## 2021-01-25 DIAGNOSIS — E890 Postprocedural hypothyroidism: Secondary | ICD-10-CM

## 2021-03-11 ENCOUNTER — Encounter: Payer: Self-pay | Admitting: Family Medicine

## 2021-03-16 ENCOUNTER — Other Ambulatory Visit: Payer: Self-pay

## 2021-03-16 MED ORDER — VENLAFAXINE HCL ER 75 MG PO CP24: 75.0000 mg | ORAL_CAPSULE | Freq: Every day | ORAL | 0 refills | Status: DC

## 2021-03-16 NOTE — Telephone Encounter (Signed)
Pt calling again in regards to this message. She is requesting to have an answer. Please advise.

## 2021-04-07 ENCOUNTER — Other Ambulatory Visit: Payer: Self-pay | Admitting: Family Medicine

## 2021-04-08 NOTE — Telephone Encounter (Signed)
Requested medications are due for refill today.  A bit early  Requested medications are on the active medications list.  yes  Last refill. 03/16/2021  Future visit scheduled.   yes  Notes to clinic.  Per Dr. Alben Spittle note from 11/3 pt is to follow up 1-2 months after starting this medication.  Please advise.

## 2021-05-20 ENCOUNTER — Encounter: Payer: Self-pay | Admitting: Family Medicine

## 2021-07-04 ENCOUNTER — Encounter: Payer: Self-pay | Admitting: Family Medicine

## 2021-07-05 ENCOUNTER — Other Ambulatory Visit: Payer: Self-pay | Admitting: Family Medicine

## 2021-07-05 ENCOUNTER — Other Ambulatory Visit: Payer: Self-pay | Admitting: Nurse Practitioner

## 2021-07-05 DIAGNOSIS — E039 Hypothyroidism, unspecified: Secondary | ICD-10-CM

## 2021-07-05 DIAGNOSIS — Z1231 Encounter for screening mammogram for malignant neoplasm of breast: Secondary | ICD-10-CM

## 2021-07-06 NOTE — Telephone Encounter (Signed)
Requested Prescriptions  Pending Prescriptions Disp Refills   levothyroxine (SYNTHROID) 125 MCG tablet [Pharmacy Med Name: LEVOTHYROXINE 125 MCG TABLET] 90 tablet 1    Sig: TAKE 1 TABLET BY MOUTH DAILY BEFORE BREAKFAST.     Endocrinology:  Hypothyroid Agents Failed - 07/05/2021  8:08 AM      Failed - TSH in normal range and within 360 days    TSH  Date Value Ref Range Status  01/06/2021 0.334 (L) 0.450 - 4.500 uIU/mL Final         Passed - Valid encounter within last 12 months    Recent Outpatient Visits          6 months ago Hypothyroidism, unspecified type   [facility] Jerrol Banana., MD   1 year ago Acute non-recurrent pansinusitis   [facility] [place], Clearnce Sorrel, [place]   1 year ago Annual physical exam   [facility] Jerrol Banana., MD   2 years ago Postoperative hypothyroidism   [facility] Jerrol Banana., MD   3 years ago Non-recurrent acute serous otitis media of left ear   [facility] Jerrol Banana., MD      Future Appointments            In 5 months Jerrol Banana., MD [facility], [place]

## 2021-07-07 DIAGNOSIS — Z1231 Encounter for screening mammogram for malignant neoplasm of breast: Secondary | ICD-10-CM

## 2021-10-08 ENCOUNTER — Other Ambulatory Visit: Payer: Self-pay | Admitting: Family Medicine

## 2021-11-01 ENCOUNTER — Ambulatory Visit: Payer: Self-pay

## 2021-11-02 ENCOUNTER — Ambulatory Visit: Payer: BC Managed Care – PPO | Admitting: Family Medicine

## 2021-11-02 ENCOUNTER — Encounter: Payer: Self-pay | Admitting: Family Medicine

## 2021-11-02 VITALS — BP 138/90 | HR 77 | Temp 98.2°F | Ht 63.0 in | Wt 164.1 lb

## 2021-11-02 DIAGNOSIS — R61 Generalized hyperhidrosis: Secondary | ICD-10-CM | POA: Diagnosis not present

## 2021-11-02 DIAGNOSIS — E039 Hypothyroidism, unspecified: Secondary | ICD-10-CM | POA: Diagnosis not present

## 2021-11-02 DIAGNOSIS — E890 Postprocedural hypothyroidism: Secondary | ICD-10-CM

## 2021-11-02 DIAGNOSIS — Z780 Asymptomatic menopausal state: Secondary | ICD-10-CM | POA: Diagnosis not present

## 2021-11-02 DIAGNOSIS — N951 Menopausal and female climacteric states: Secondary | ICD-10-CM

## 2021-11-02 DIAGNOSIS — F339 Major depressive disorder, recurrent, unspecified: Secondary | ICD-10-CM

## 2021-11-02 MED ORDER — VENLAFAXINE HCL ER 150 MG PO CP24: 150.0000 mg | ORAL_CAPSULE | Freq: Every day | ORAL | 1 refills | Status: DC

## 2021-11-02 NOTE — Progress Notes (Signed)
Established patient visit   Patient: [name]   DOB: Sep 20, 1975   46 y.o. Female  MRN: [id number] Visit Date: 11/02/2021  Today's healthcare provider: [name], MD   No chief complaint on file.  Subjective    HPI  Patient is in today due to excessive sweating. Reports it has been taking place for about a week and a half to weeks now. Indicates that it is getting worse. Nothing in day to day routine that she thinks may be contributing. Has tried changing deodorants and nothing has changed. No other symptoms associated.  Patient had total hysterectomy about 6 years ago but these symptoms only worsen about a month ago.  She has been on Effexor 75 mg for milder symptoms to this point. She has had no fever chills weight loss cough.  She has quit smoking Medications: Outpatient Medications Prior to Visit  Medication Sig   levothyroxine (SYNTHROID) 125 MCG tablet TAKE 1 TABLET BY MOUTH DAILY BEFORE BREAKFAST.   loratadine (CLARITIN) 10 MG tablet Take 10 mg by mouth 2 (two) times daily.   [DISCONTINUED] venlafaxine XR (EFFEXOR-XR) 75 MG 24 hr capsule TAKE 1 CAPSULE BY MOUTH DAILY WITH BREAKFAST.   No facility-administered medications prior to visit.    Review of Systems  Constitutional:  Positive for diaphoresis. Negative for appetite change, chills, fatigue and fever.  Respiratory:  Negative for chest tightness and shortness of breath.   Cardiovascular:  Negative for chest pain and palpitations.  Gastrointestinal:  Negative for abdominal pain, nausea and vomiting.  Neurological:  Negative for dizziness and weakness.    Last thyroid functions Lab Results  Component Value Date   TSH 0.334 (L) 01/06/2021   T3TOTAL 191.0 01/14/2013       Objective    BP 138/90 (BP Location: Right Arm, Patient Position: Sitting, Cuff Size: Normal)   Pulse 77   Temp 98.2 F (36.8 C) (Oral)   Ht '5\' 3"'$  (1.6 m)   Wt 164 lb 1.6 oz (74.4 kg)   LMP 01/23/2015   SpO2 96%   BMI  29.07 kg/m  BP Readings from Last 3 Encounters:  11/02/21 138/90  01/06/21 (!) 139/94  07/24/19 119/85   Wt Readings from Last 3 Encounters:  11/02/21 164 lb 1.6 oz (74.4 kg)  01/06/21 167 lb (75.8 kg)  07/24/19 173 lb (78.5 kg)      Physical Exam Vitals reviewed.  HENT:     Head: Normocephalic and atraumatic.     Right Ear: External ear normal.     Left Ear: External ear normal.  Eyes:     General: No scleral icterus. Cardiovascular:     Rate and Rhythm: Regular rhythm.     Heart sounds: Normal heart sounds.  Pulmonary:     Breath sounds: Normal breath sounds.  Abdominal:     Palpations: Abdomen is soft.  Musculoskeletal:     Cervical back: Neck supple.  Skin:    General: Skin is warm and dry.  Neurological:     General: No focal deficit present.     Mental Status: She is alert and oriented to person, place, and time.  Psychiatric:        Mood and Affect: Mood normal.        Behavior: Behavior normal.        Thought Content: Thought content normal.        Judgment: Judgment normal.       No results found for any  visits on 11/02/21.  Assessment & Plan     1. Menopausal symptoms At this time increase Effexor from 75 to 150 mg daily  2. Postmenopausal Discussed at length the possibility of HRT with estradiol. We will wait on this for now as risk and benefits were discussed  3. Hypothyroidism, unspecified type  - CBC w/Diff/Platelet - T4, free - TSH - FSH/LH  4. Excessive sweating No evidence of systemic disease. I praised the patient that she quit smoking - CBC w/Diff/Platelet - T4, free - TSH - FSH/LH  5. Depression, recurrent (HCC)  - venlafaxine XR (EFFEXOR-XR) 150 MG 24 hr capsule; Take 1 capsule (150 mg total) by mouth daily with breakfast.  Dispense: 90 capsule; Refill: 1  6. S/P total thyroidectomy Check for euthyroid TSH   Return in about 2 months (around 01/02/2022).      Rada Hay    [name], MD  [facility] [phone] (phone) [phone] (fax)  [place]

## 2021-11-03 LAB — CBC WITH DIFFERENTIAL/PLATELET
Basophils Absolute: 0.1 10*3/uL (ref 0.0–0.2)
Basos: 1 %
EOS (ABSOLUTE): 0.4 10*3/uL (ref 0.0–0.4)
Eos: 5 %
Hematocrit: 40.1 % (ref 34.0–46.6)
Hemoglobin: 14.3 g/dL (ref 11.1–15.9)
Immature Grans (Abs): 0 10*3/uL (ref 0.0–0.1)
Immature Granulocytes: 0 %
Lymphocytes Absolute: 2.1 10*3/uL (ref 0.7–3.1)
Lymphs: 25 %
MCH: 32.6 pg (ref 26.6–33.0)
MCHC: 35.7 g/dL (ref 31.5–35.7)
MCV: 91 fL (ref 79–97)
Monocytes Absolute: 0.7 10*3/uL (ref 0.1–0.9)
Monocytes: 9 %
Neutrophils Absolute: 4.8 10*3/uL (ref 1.4–7.0)
Neutrophils: 60 %
Platelets: 263 10*3/uL (ref 150–450)
RBC: 4.39 x10E6/uL (ref 3.77–5.28)
RDW: 11.8 % (ref 11.7–15.4)
WBC: 8.1 10*3/uL (ref 3.4–10.8)

## 2021-11-03 LAB — TSH: TSH: 0.139 u[IU]/mL — ABNORMAL LOW (ref 0.450–4.500)

## 2021-11-03 LAB — FSH/LH
FSH: 104 m[IU]/mL
LH: 53.2 m[IU]/mL

## 2021-11-04 ENCOUNTER — Other Ambulatory Visit: Payer: Self-pay

## 2021-11-04 DIAGNOSIS — E039 Hypothyroidism, unspecified: Secondary | ICD-10-CM

## 2021-11-04 MED ORDER — LEVOTHYROXINE SODIUM 100 MCG PO TABS: 100.0000 ug | ORAL_TABLET | Freq: Every day | ORAL | 3 refills | Status: DC

## 2021-11-16 ENCOUNTER — Encounter: Payer: Self-pay | Admitting: Family Medicine

## 2021-11-16 NOTE — Telephone Encounter (Signed)
Please advise 

## 2021-11-19 ENCOUNTER — Other Ambulatory Visit: Payer: Self-pay | Admitting: *Deleted

## 2021-11-19 DIAGNOSIS — R61 Generalized hyperhidrosis: Secondary | ICD-10-CM

## 2021-11-19 DIAGNOSIS — N951 Menopausal and female climacteric states: Secondary | ICD-10-CM

## 2021-11-19 MED ORDER — ESTRADIOL 0.5 MG PO TABS: 0.5000 mg | ORAL_TABLET | Freq: Every day | ORAL | 2 refills | Status: DC

## 2021-12-13 ENCOUNTER — Other Ambulatory Visit: Payer: Self-pay | Admitting: Family Medicine

## 2021-12-13 DIAGNOSIS — N951 Menopausal and female climacteric states: Secondary | ICD-10-CM

## 2021-12-13 DIAGNOSIS — R61 Generalized hyperhidrosis: Secondary | ICD-10-CM

## 2021-12-21 ENCOUNTER — Encounter: Payer: Self-pay | Admitting: Family Medicine

## 2021-12-21 ENCOUNTER — Telehealth: Payer: Self-pay

## 2021-12-21 ENCOUNTER — Other Ambulatory Visit: Payer: Self-pay

## 2021-12-21 ENCOUNTER — Ambulatory Visit (INDEPENDENT_AMBULATORY_CARE_PROVIDER_SITE_OTHER): Payer: BC Managed Care – PPO | Admitting: Family Medicine

## 2021-12-21 ENCOUNTER — Other Ambulatory Visit: Payer: Self-pay | Admitting: Family Medicine

## 2021-12-21 VITALS — BP 150/99 | HR 78 | Resp 14 | Ht 63.0 in | Wt 168.0 lb

## 2021-12-21 DIAGNOSIS — F339 Major depressive disorder, recurrent, unspecified: Secondary | ICD-10-CM

## 2021-12-21 DIAGNOSIS — Z1211 Encounter for screening for malignant neoplasm of colon: Secondary | ICD-10-CM

## 2021-12-21 DIAGNOSIS — Z780 Asymptomatic menopausal state: Secondary | ICD-10-CM | POA: Diagnosis not present

## 2021-12-21 DIAGNOSIS — Z1231 Encounter for screening mammogram for malignant neoplasm of breast: Secondary | ICD-10-CM

## 2021-12-21 DIAGNOSIS — Z0000 Encounter for general adult medical examination without abnormal findings: Secondary | ICD-10-CM | POA: Diagnosis not present

## 2021-12-21 DIAGNOSIS — I10 Essential (primary) hypertension: Secondary | ICD-10-CM

## 2021-12-21 DIAGNOSIS — J309 Allergic rhinitis, unspecified: Secondary | ICD-10-CM

## 2021-12-21 DIAGNOSIS — E7849 Other hyperlipidemia: Secondary | ICD-10-CM

## 2021-12-21 DIAGNOSIS — E039 Hypothyroidism, unspecified: Secondary | ICD-10-CM

## 2021-12-21 MED ORDER — NA SULFATE-K SULFATE-MG SULF 17.5-3.13-1.6 GM/177ML PO SOLN: 1.0000 | Freq: Once | ORAL | 0 refills | Status: AC

## 2021-12-21 MED ORDER — VENLAFAXINE HCL ER 75 MG PO CP24: 75.0000 mg | ORAL_CAPSULE | Freq: Every day | ORAL | 1 refills | Status: DC

## 2021-12-21 NOTE — Telephone Encounter (Signed)
Gastroenterology Pre-Procedure Review  Request Date: 01/19/22 Requesting Physician: Dr. Marius Ditch  PATIENT REVIEW QUESTIONS: The patient responded to the following health history questions as indicated:    1. Are you having any GI issues? yes (chronic colitis diagnosed years ago) 2. Do you have a personal history of Polyps? no 3. Do you have a family history of Colon Cancer or Polyps? yes (paternal grandmother colon cancer) 4. Diabetes Mellitus? no 5. Joint replacements in the past 12 months?no 6. Major health problems in the past 3 months?no 7. Any artificial heart valves, MVP, or defibrillator?no    MEDICATIONS & ALLERGIES:    Patient reports the following regarding taking any anticoagulation/antiplatelet therapy:   Plavix, Coumadin, Eliquis, Xarelto, Lovenox, Pradaxa, Brilinta, or Effient? no Aspirin? no  Patient confirms/reports the following medications:  Current Outpatient Medications  Medication Sig Dispense Refill   estradiol (ESTRACE) 0.5 MG tablet Take 1 tablet (0.5 mg total) by mouth daily. 30 tablet 2   levothyroxine (SYNTHROID) 100 MCG tablet Take 1 tablet (100 mcg total) by mouth daily before breakfast. 90 tablet 3   loratadine (CLARITIN) 10 MG tablet Take 10 mg by mouth 2 (two) times daily.     venlafaxine XR (EFFEXOR-XR) 75 MG 24 hr capsule Take 1 capsule (75 mg total) by mouth daily with breakfast. 90 capsule 1   No current facility-administered medications for this visit.    Patient confirms/reports the following allergies:  Allergies  Allergen Reactions   Allegra [Fexofenadine] Rash    No orders of the defined types were placed in this encounter.   AUTHORIZATION INFORMATION Primary Insurance: 1D#: Group #:  Secondary Insurance: 1D#: Group #:  SCHEDULE INFORMATION: Date: 01/19/22 Time: Location: [facility]

## 2021-12-21 NOTE — Progress Notes (Unsigned)
Complete physical exam  I,[name],acting as a scribe for [name], MD.,have documented all relevant documentation on the behalf of [name], MD,as directed by  [name], MD while in the presence of [name], MD.   Patient: [name]   DOB: 12/27/1975   46 y.o. Female  MRN: [id number] Visit Date: 12/21/2021  Today's healthcare provider: [name], MD   Chief Complaint  Patient presents with   Annual Exam   Subjective    [name] is a 46 y.o. female who presents today for a complete physical exam.  She reports consuming a general diet. Home exercise routine includes walking. She generally feels well. She reports sleeping fairly well. She does not have additional problems to discuss today.  She has quit smoking.  Overall she feels well.  She has not had a mammogram since 2015.  No breast issues. HPI    Past Medical History:  Diagnosis Date   Dysmenorrhea    History of goiter    multinodalar thyroid goiter--  s/p  total thyroidectomy---  per path,  Fibrotic adenomatous nodule    History of hyperthyroidism    RAI tx March 2015   Hypothyroidism, postradioiodine therapy    Menorrhagia    Uterus, adenomyosis    Past Surgical History:  Procedure Laterality Date   ABDOMINAL HYSTERECTOMY  2017   CESAREAN SECTION  07-17-2006   LAPAROSCOPIC TUBAL LIGATION  08-23-2006   w/  fulgeration billaterlly   THYROIDECTOMY N/A 08/19/2014   Procedure: TOTAL THYROIDECTOMY;  Surgeon: [name], MD;  Location: [facility];  Service: General;  Laterality: N/A;   Social History   Socioeconomic History   Marital status: Married    Spouse name: Not on file   Number of children: Not on file   Years of education: Not on file   Highest education level: Not on file  Occupational History   Not on file  Tobacco Use   Smoking status: Former    Packs/day: 1.00    Years: 10.00    Total pack years: 10.00    Types: Cigarettes    Smokeless tobacco: Never   Tobacco comments:    stopped 07/16/2018  Vaping Use   Vaping Use: Never used  Substance and Sexual Activity   Alcohol use: Yes    Alcohol/week: 6.0 standard drinks of alcohol    Types: 6 Standard drinks or equivalent per week    Comment: weekends-- 3 to 4   Drug use: No   Sexual activity: Not on file  Other Topics Concern   Not on file  Social History Narrative   Not on file   Social Determinants of Health   Financial Resource Strain: Not on file  Food Insecurity: Not on file  Transportation Needs: Not on file  Physical Activity: Not on file  Stress: Not on file  Social Connections: Not on file  Intimate Partner Violence: Not on file   Family Status  Relation Name Status   Father  Deceased   MGM  (Not Specified)   Mother  Alive   Brother  Alive   PGM  (Not Specified)   Family History  Problem Relation Age of Onset   Diabetes Father    Heart disease Father    Hypertension Father    Cancer Maternal Grandmother    Colon cancer Paternal Grandmother    Ulcerative colitis Paternal Grandmother    Allergies  Allergen Reactions   Allegra [  Fexofenadine] Rash    Patient Care Team: Jerrol Banana., MD as PCP - General (Family Medicine)   Medications: Outpatient Medications Prior to Visit  Medication Sig   estradiol (ESTRACE) 0.5 MG tablet Take 1 tablet (0.5 mg total) by mouth daily.   levothyroxine (SYNTHROID) 100 MCG tablet Take 1 tablet (100 mcg total) by mouth daily before breakfast.   loratadine (CLARITIN) 10 MG tablet Take 10 mg by mouth 2 (two) times daily.   [DISCONTINUED] venlafaxine XR (EFFEXOR-XR) 150 MG 24 hr capsule Take 1 capsule (150 mg total) by mouth daily with breakfast.   No facility-administered medications prior to visit.    Review of Systems  All other systems reviewed and are negative.      Objective     BP (!) 150/99 (BP Location: Left Arm, Patient Position: Sitting, Cuff Size: Normal)   Pulse 78   Resp  14   Ht '5\' 3"'$  (1.6 m)   Wt 168 lb (76.2 kg)   LMP 01/23/2015   SpO2 98%   BMI 29.76 kg/m  BP Readings from Last 3 Encounters:  12/21/21 (!) 150/99  11/02/21 138/90  01/06/21 (!) 139/94   Wt Readings from Last 3 Encounters:  12/21/21 168 lb (76.2 kg)  11/02/21 164 lb 1.6 oz (74.4 kg)  01/06/21 167 lb (75.8 kg)       Physical Exam Vitals reviewed.  HENT:     Head: Normocephalic and atraumatic.     Right Ear: External ear normal.     Left Ear: External ear normal.  Eyes:     General: No scleral icterus. Cardiovascular:     Rate and Rhythm: Regular rhythm.     Heart sounds: Normal heart sounds.  Pulmonary:     Breath sounds: Normal breath sounds.  Chest:  Breasts:    Right: Normal.     Left: Normal.  Abdominal:     Palpations: Abdomen is soft.  Musculoskeletal:     Cervical back: Neck supple.  Skin:    General: Skin is warm and dry.  Neurological:     General: No focal deficit present.     Mental Status: She is alert and oriented to person, place, and time.  Psychiatric:        Mood and Affect: Mood normal.        Behavior: Behavior normal.        Thought Content: Thought content normal.        Judgment: Judgment normal.       Last depression screening scores    12/21/2021    9:10 AM 11/02/2021    2:25 PM 01/06/2021    8:46 AM  PHQ 2/9 Scores  PHQ - 2 Score 0 0 0  PHQ- 9 Score '4 5 5   '$ Last fall risk screening    12/21/2021    9:10 AM  [place] in the past year? 0  Number falls in past yr: 0  Injury with Fall? 0  Risk for fall due to : No Fall Risks  Follow up Falls evaluation completed   Last Audit-C alcohol use screening    12/21/2021    9:09 AM  Alcohol Use Disorder Test (AUDIT)  1. How often do you have a drink containing alcohol? 4  2. How many drinks containing alcohol do you have on a typical day when you are drinking? 1  3. How often do you have six or more drinks on one occasion? 3  AUDIT-C  Score 8  4. How often during the  last year have you found that you were not able to stop drinking once you had started? 0  5. How often during the last year have you failed to do what was normally expected from you because of drinking? 0  6. How often during the last year have you needed a first drink in the morning to get yourself going after a heavy drinking session? 0  7. How often during the last year have you had a feeling of guilt of remorse after drinking? 0  8. How often during the last year have you been unable to remember what happened the night before because you had been drinking? 0  9. Have you or someone else been injured as a result of your drinking? 0  10. Has a relative or friend or a doctor or another health worker been concerned about your drinking or suggested you cut down? 0  Alcohol Use Disorder Identification Test Final Score (AUDIT) 8  Alcohol Brief Interventions/Follow-up Alcohol education/Brief advice   A score of 3 or more in women, and 4 or more in men indicates increased risk for alcohol abuse, EXCEPT if all of the points are from question 1   No results found for any visits on 12/21/21.  Assessment & Plan    Routine Health Maintenance and Physical Exam  Exercise Activities and Dietary recommendations  Goals   None     Immunization History  Administered Date(s) Administered   Tdap 08/23/2013    Health Maintenance  Topic Date Due   COVID-19 Vaccine (1) Never done   HIV Screening  Never done   Hepatitis C Screening  Never done   COLONOSCOPY (Pts 45-81yr Insurance coverage will need to be confirmed)  10/16/2020   INFLUENZA VACCINE  12/07/2021   PAP SMEAR-Modifier  07/24/2022   TETANUS/TDAP  08/24/2023   HPV VACCINES  Aged Out    Discussed health benefits of physical activity, and encouraged her to engage in regular exercise appropriate for her age and condition.  1. Annual physical exam Mammogram is ordered and patient encouraged to call for screening mammogram that is overdue -  Lipid panel - Comprehensive Metabolic Panel (CMET)  2. Other hyperlipidemia  - Lipid panel - Comprehensive Metabolic Panel (CMET)  3. Elevated blood pressure reading with diagnosis of hypertension  - Lipid panel - Comprehensive Metabolic Panel (CMET)  4. Postmenopausal  - Lipid panel - Comprehensive Metabolic Panel (CMET)  5. Hypothyroidism, unspecified type  - Lipid panel - Comprehensive Metabolic Panel (CMET)  6. Allergic rhinitis, unspecified seasonality, unspecified trigger  - Lipid panel - Comprehensive Metabolic Panel (CMET)  7. Depression, recurrent (HCC)  - venlafaxine XR (EFFEXOR-XR) 75 MG 24 hr capsule; Take 1 capsule (75 mg total) by mouth daily with breakfast.  Dispense: 90 capsule; Refill: 1  8. Encounter for screening colonoscopy For at 469for screening colonoscopy - Ambulatory referral to Gastroenterology   No follow-ups on file.     I, RWilhemena Durie MD, have reviewed all documentation for this visit. The documentation on 12/23/21 for the exam, diagnosis, procedures, and orders are all accurate and complete.    Rogerio [name] GCranford Mon MD  [phone](phone) [phone](fax)  [place]

## 2021-12-24 ENCOUNTER — Telehealth: Payer: Self-pay

## 2021-12-24 NOTE — Telephone Encounter (Signed)
Patient has requested to reschedule her colonoscopy due to work event scheduled.  Her colonoscopy has been rescheduled to 02/02/22.

## 2022-01-09 ENCOUNTER — Other Ambulatory Visit: Payer: Self-pay | Admitting: Family Medicine

## 2022-01-09 DIAGNOSIS — E039 Hypothyroidism, unspecified: Secondary | ICD-10-CM

## 2022-01-11 ENCOUNTER — Telehealth: Payer: Self-pay | Admitting: Family Medicine

## 2022-01-11 NOTE — Telephone Encounter (Signed)
[facility] called and spoke to [place], [facility] about the refill(s) Levothyroxine requested. Advised it was sent on 11/04/21 #90/3 refill(s). She says the patient picked up on 6/30 and is due for a refill on 02/10/22. Patient called and advised of the above, she verbalized understanding.

## 2022-01-11 NOTE — Telephone Encounter (Signed)
Copied from [place] [phone]. Topic: General - Other >> Jan 11, 2022 10:03 AM Everette C wrote: Reason for CRM: Medication Refill - Medication: levothyroxine (SYNTHROID) 100 MCG tablet [[id number]]   Has the patient contacted their pharmacy? Yes.  The patient's pharmacy has directed the patient to contact their PCP  (Agent: If no, request that the patient contact the pharmacy for the refill. If patient does not wish to contact the pharmacy document the reason why and proceed with request.) (Agent: If yes, when and what did the pharmacy advise?)  Preferred Pharmacy (with phone number or street name): [id number] [id number]: [phone]: [phone]: Not open 24 hours   Has the patient been seen for an appointment in the last year OR does the patient have an upcoming appointment? Yes.    Agent: Please be advised that RX refills may take up to 3 business days. We ask that you follow-up with your pharmacy.

## 2022-01-13 ENCOUNTER — Other Ambulatory Visit: Payer: Self-pay | Admitting: Family Medicine

## 2022-01-13 DIAGNOSIS — R61 Generalized hyperhidrosis: Secondary | ICD-10-CM

## 2022-01-13 DIAGNOSIS — N951 Menopausal and female climacteric states: Secondary | ICD-10-CM

## 2022-02-01 ENCOUNTER — Encounter: Payer: Self-pay | Admitting: Gastroenterology

## 2022-02-02 ENCOUNTER — Encounter: Admission: RE | Payer: Self-pay | Source: Ambulatory Visit

## 2022-02-02 ENCOUNTER — Ambulatory Visit
Admission: RE | Admit: 2022-02-02 | Payer: BC Managed Care – PPO | Source: Ambulatory Visit | Admitting: Gastroenterology

## 2022-02-02 HISTORY — DX: Thyrotoxicosis, unspecified without thyrotoxic crisis or storm: E05.90

## 2022-02-02 SURGERY — COLONOSCOPY WITH PROPOFOL
Anesthesia: General

## 2022-03-08 DIAGNOSIS — E039 Hypothyroidism, unspecified: Secondary | ICD-10-CM | POA: Diagnosis not present

## 2022-03-08 DIAGNOSIS — E7849 Other hyperlipidemia: Secondary | ICD-10-CM | POA: Diagnosis not present

## 2022-03-08 DIAGNOSIS — Z0000 Encounter for general adult medical examination without abnormal findings: Secondary | ICD-10-CM | POA: Diagnosis not present

## 2022-03-08 DIAGNOSIS — Z780 Asymptomatic menopausal state: Secondary | ICD-10-CM | POA: Diagnosis not present

## 2022-03-08 DIAGNOSIS — I10 Essential (primary) hypertension: Secondary | ICD-10-CM | POA: Diagnosis not present

## 2022-03-09 LAB — COMPREHENSIVE METABOLIC PANEL
ALT: 15 IU/L (ref 0–32)
AST: 20 IU/L (ref 0–40)
Albumin/Globulin Ratio: 1.9 (ref 1.2–2.2)
Albumin: 4.3 g/dL (ref 3.9–4.9)
Alkaline Phosphatase: 84 IU/L (ref 44–121)
BUN/Creatinine Ratio: 7 — ABNORMAL LOW (ref 9–23)
BUN: 5 mg/dL — ABNORMAL LOW (ref 6–24)
Bilirubin Total: <0.2 mg/dL (ref 0.0–1.2)
CO2: 21 mmol/L (ref 20–29)
Calcium: 8.9 mg/dL (ref 8.7–10.2)
Chloride: 105 mmol/L (ref 96–106)
Creatinine, Ser: 0.7 mg/dL (ref 0.57–1.00)
Globulin, Total: 2.3 g/dL (ref 1.5–4.5)
Glucose: 96 mg/dL (ref 70–99)
Potassium: 4.2 mmol/L (ref 3.5–5.2)
Sodium: 141 mmol/L (ref 134–144)
Total Protein: 6.6 g/dL (ref 6.0–8.5)
eGFR: 108 mL/min/{1.73_m2} (ref >59)

## 2022-03-09 LAB — LIPID PANEL
Chol/HDL Ratio: 4.5 ratio — ABNORMAL HIGH (ref 0.0–4.4)
Cholesterol, Total: 251 mg/dL — ABNORMAL HIGH (ref 100–199)
HDL: 56 mg/dL (ref >39)
LDL Chol Calc (NIH): 124 mg/dL — ABNORMAL HIGH (ref 0–99)
Triglycerides: 400 mg/dL — ABNORMAL HIGH (ref 0–149)
VLDL Cholesterol Cal: 71 mg/dL — ABNORMAL HIGH (ref 5–40)

## 2022-03-10 ENCOUNTER — Other Ambulatory Visit: Payer: Self-pay

## 2022-03-10 DIAGNOSIS — E7800 Pure hypercholesterolemia, unspecified: Secondary | ICD-10-CM

## 2022-08-10 ENCOUNTER — Other Ambulatory Visit: Payer: Self-pay

## 2022-08-10 ENCOUNTER — Encounter: Payer: Self-pay | Admitting: Family Medicine

## 2022-08-10 ENCOUNTER — Ambulatory Visit: Payer: BC Managed Care – PPO | Admitting: Family Medicine

## 2022-08-10 ENCOUNTER — Telehealth: Payer: Self-pay

## 2022-08-10 VITALS — BP 172/109 | HR 78 | Temp 97.6°F | Resp 16 | Ht 63.0 in | Wt 172.9 lb

## 2022-08-10 DIAGNOSIS — R030 Elevated blood-pressure reading, without diagnosis of hypertension: Secondary | ICD-10-CM | POA: Diagnosis not present

## 2022-08-10 DIAGNOSIS — Z1159 Encounter for screening for other viral diseases: Secondary | ICD-10-CM | POA: Diagnosis not present

## 2022-08-10 DIAGNOSIS — Z1231 Encounter for screening mammogram for malignant neoplasm of breast: Secondary | ICD-10-CM

## 2022-08-10 DIAGNOSIS — Z780 Asymptomatic menopausal state: Secondary | ICD-10-CM | POA: Diagnosis not present

## 2022-08-10 DIAGNOSIS — Z114 Encounter for screening for human immunodeficiency virus [HIV]: Secondary | ICD-10-CM

## 2022-08-10 DIAGNOSIS — Z1211 Encounter for screening for malignant neoplasm of colon: Secondary | ICD-10-CM

## 2022-08-10 DIAGNOSIS — E890 Postprocedural hypothyroidism: Secondary | ICD-10-CM | POA: Diagnosis not present

## 2022-08-10 DIAGNOSIS — R61 Generalized hyperhidrosis: Secondary | ICD-10-CM | POA: Diagnosis not present

## 2022-08-10 MED ORDER — PAROXETINE HCL 10 MG PO TABS: 10.0000 mg | ORAL_TABLET | Freq: Every day | ORAL | 11 refills | Status: DC

## 2022-08-10 MED ORDER — NA SULFATE-K SULFATE-MG SULF 17.5-3.13-1.6 GM/177ML PO SOLN: 1.0000 | Freq: Once | ORAL | 0 refills | Status: AC

## 2022-08-10 NOTE — Assessment & Plan Note (Signed)
Chronic, previously over corrected Repeat labs at this time with sweating and hypertension as well as sleep disturbance and weight gain Previously on 100 mcg synthroid

## 2022-08-10 NOTE — Assessment & Plan Note (Signed)
Agreeable to colonoscopy; referral placed

## 2022-08-10 NOTE — Assessment & Plan Note (Signed)
BP elevated; first time meeting patient following previous PCP leaving practice Will check labs today; recommend 2 week follow up to be scheduled to address need to start anti-hypertensives

## 2022-08-10 NOTE — Patient Instructions (Signed)
Please call and schedule your mammogram:  [facility]  [place], [place],  [place]  [address] Get Driving Directions Main: [phone]  Sunday:Closed Monday:7:20 AM - 5:00 PM Tuesday:7:20 AM - 5:00 PM Wednesday:7:20 AM - 5:00 PM Thursday:7:20 AM - 5:00 PM Friday:7:20 AM - 4:30 PM Saturday:Closed  You will be contacted from [place] for colon cancer screening

## 2022-08-10 NOTE — Assessment & Plan Note (Signed)
Acute on chronic, worsening in last 1 month Previously on estrace 0.5 mg to assist; patient feels this is not working Trial of paxil 10 mg to assist at bedtime

## 2022-08-10 NOTE — Telephone Encounter (Signed)
Gastroenterology Pre-Procedure Review  Request Date: 08/24/22 Requesting Physician: Dr. Vicente Males  PATIENT REVIEW QUESTIONS: The patient responded to the following health history questions as indicated:    1. Are you having any GI issues?  Pt stated she was diagnosed with chronic colitis many years ago 2. Do you have a personal history of Polyps? no 3. Do you have a family history of Colon Cancer or Polyps? yes (paternal grandmother colon cancer) 4. Diabetes Mellitus? no 5. Joint replacements in the past 12 months?no 6. Major health problems in the past 3 months?no 7. Any artificial heart valves, MVP, or defibrillator?no    MEDICATIONS & ALLERGIES:    Patient reports the following regarding taking any anticoagulation/antiplatelet therapy:   Plavix, Coumadin, Eliquis, Xarelto, Lovenox, Pradaxa, Brilinta, or Effient? no Aspirin? no  Patient confirms/reports the following medications:  Current Outpatient Medications  Medication Sig Dispense Refill   estradiol (ESTRACE) 0.5 MG tablet TAKE 1 TABLET BY MOUTH EVERY DAY 90 tablet 1   levothyroxine (SYNTHROID) 100 MCG tablet Take 1 tablet (100 mcg total) by mouth daily before breakfast. 90 tablet 3   loratadine (CLARITIN) 10 MG tablet Take 10 mg by mouth 2 (two) times daily.     PARoxetine (PAXIL) 10 MG tablet Take 1 tablet (10 mg total) by mouth daily. 30 tablet 11   No current facility-administered medications for this visit.    Patient confirms/reports the following allergies:  Allergies  Allergen Reactions   Allegra [Fexofenadine] Rash    No orders of the defined types were placed in this encounter.   AUTHORIZATION INFORMATION Primary Insurance: 1D#: Group #:  Secondary Insurance: 1D#: Group #:  SCHEDULE INFORMATION: Date: 08/24/22 Time: Location: [facility]

## 2022-08-10 NOTE — Assessment & Plan Note (Signed)
S/p hysterectomy; multi-focal complaints including weight gain, sweating, insomnia

## 2022-08-10 NOTE — Assessment & Plan Note (Signed)
Due for screening for mammogram, denies breast concerns, provided with phone number to call and schedule appointment for mammogram. Encouraged to repeat breast cancer screening every 1-2 years.  

## 2022-08-10 NOTE — Progress Notes (Signed)
I,[name],acting as a Education administrator for Connie Sprout, Gill.,have documented all relevant documentation on the behalf of Connie Sprout, Gill,as directed by  Connie Sprout, Gill while in the presence of Connie Sprout, Gill.   Established patient visit  Patient: [name]   DOB: [birth year]   47 y.o. Female  MRN: [id number] Visit Date: 08/10/2022  Today's healthcare provider: [name], Gill  Patient presents for new patient visit to establish care.  Introduced to Designer, jewellery role and practice setting.  All questions answered.  Discussed provider/patient relationship and expectations.  Chief Complaint  Patient presents with   Follow-up   Subjective    HPI  Patient reports menopause symptoms are worsening. She reports insomnia and waking up tired. She reports not able to loss weight. She reports taking estradiol 0.5 mg daily.   Medications: Outpatient Medications Prior to Visit  Medication Sig   estradiol (ESTRACE) 0.5 MG tablet TAKE 1 TABLET BY MOUTH EVERY DAY   levothyroxine (SYNTHROID) 100 MCG tablet Take 1 tablet (100 mcg total) by mouth daily before breakfast.   loratadine (CLARITIN) 10 MG tablet Take 10 mg by mouth 2 (two) times daily.   [DISCONTINUED] venlafaxine XR (EFFEXOR-XR) 75 MG 24 hr capsule Take 1 capsule (75 mg total) by mouth daily with breakfast. (Patient not taking: Reported on 08/10/2022)   No facility-administered medications prior to visit.    Review of Systems  Constitutional:  Positive for diaphoresis and fatigue.  Respiratory:  Negative for chest tightness and shortness of breath.   Cardiovascular:  Negative for leg swelling.     Objective    BP (!) 172/109 Comment: manual  Pulse 78   Temp 97.6 F (36.4 C) (Temporal)   Resp 16   Ht 5\' 3"  (1.6 m)   Wt 172 lb 14.4 oz (78.4 kg)   LMP 01/23/2015   SpO2 97%   BMI 30.63 kg/m   BP Readings from Last 3 Encounters:  08/10/22 (!) 172/109  12/21/21 (!) 150/99  11/02/21 138/90   Wt Readings  from Last 3 Encounters:  08/10/22 172 lb 14.4 oz (78.4 kg)  12/21/21 168 lb (76.2 kg)  11/02/21 164 lb 1.6 oz (74.4 kg)   Physical Exam Vitals and nursing note reviewed.  Constitutional:      General: She is not in acute distress.    Appearance: Normal appearance. She is obese. She is not ill-appearing, toxic-appearing or diaphoretic.  HENT:     Head: Normocephalic and atraumatic.  Cardiovascular:     Rate and Rhythm: Normal rate and regular rhythm.     Pulses: Normal pulses.     Heart sounds: Normal heart sounds. No murmur heard.    No friction rub. No gallop.  Pulmonary:     Effort: Pulmonary effort is normal. No respiratory distress.     Breath sounds: Normal breath sounds. No stridor. No wheezing, rhonchi or rales.  Chest:     Chest wall: No tenderness.  Musculoskeletal:        General: No swelling, tenderness, deformity or signs of injury. Normal range of motion.     Cervical back: Normal range of motion and neck supple. No rigidity or tenderness.     Right lower leg: No edema.     Left lower leg: No edema.  Lymphadenopathy:     Cervical: No cervical adenopathy.  Skin:    General: Skin is warm and dry.     Capillary Refill: Capillary refill takes less  than 2 seconds.     Coloration: Skin is not jaundiced or pale.     Findings: No bruising, erythema, lesion or rash.  Neurological:     General: No focal deficit present.     Mental Status: She is alert and oriented to person, place, and time. Mental status is at baseline.     Cranial Nerves: No cranial nerve deficit.     Sensory: No sensory deficit.     Motor: No weakness.     Coordination: Coordination normal.  Psychiatric:        Mood and Affect: Mood normal.        Behavior: Behavior normal.        Thought Content: Thought content normal.        Judgment: Judgment normal.     No results found for any visits on 08/10/22.  Assessment & Plan     Problem List Items Addressed This Visit       Endocrine    Hypothyroidism    Chronic, previously over corrected Repeat labs at this time with sweating and hypertension as well as sleep disturbance and weight gain Previously on 100 mcg synthroid      Relevant Orders   TSH + free T4     Other   Colon cancer screening    Agreeable to colonoscopy; referral placed      Relevant Orders   Ambulatory referral to Gastroenterology   Elevated blood pressure reading in office without diagnosis of hypertension    BP elevated; first time meeting patient following previous PCP leaving practice Will check labs today; recommend 2 week follow up to be scheduled to address need to start anti-hypertensives      Encounter for screening mammogram for malignant neoplasm of breast    Due for screening for mammogram, denies breast concerns, provided with phone number to call and schedule appointment for mammogram. Encouraged to repeat breast cancer screening every 1-2 years.       Relevant Orders   MM 3D SCREENING MAMMOGRAM BILATERAL BREAST   Excessive sweating - Primary    Acute on chronic, worsening in last 1 month Previously on estrace 0.5 mg to assist; patient feels this is not working Trial of paxil 10 mg to assist at bedtime      Relevant Orders   CBC with Differential/Platelet   Comprehensive Metabolic Panel (CMET)   Hemoglobin A1c   Vitamin D (25 hydroxy)   B12 and Folate Panel   Lipid panel   Postmenopausal    S/p hysterectomy; multi-focal complaints including weight gain, sweating, insomnia      Other Visit Diagnoses     Encounter for screening for HIV       Relevant Orders   HIV antibody (with reflex)   Encounter for hepatitis C screening test for low risk patient       Relevant Orders   Hepatitis C Antibody      Return in about 2 weeks (around 08/24/2022) for blood pressure check.     Connie Kotyk, Gill, have reviewed all documentation for this visit. The documentation on 08/10/22 for the exam, diagnosis, procedures, and  orders are all accurate and complete.  [name], [place] [phone] (phone) [phone] (fax)  [place]

## 2022-08-11 ENCOUNTER — Other Ambulatory Visit: Payer: Self-pay | Admitting: Family Medicine

## 2022-08-11 LAB — CBC WITH DIFFERENTIAL/PLATELET
Basophils Absolute: 0.1 10*3/uL (ref 0.0–0.2)
Basos: 1 %
EOS (ABSOLUTE): 0.6 10*3/uL — ABNORMAL HIGH (ref 0.0–0.4)
Eos: 9 %
Hematocrit: 42.1 % (ref 34.0–46.6)
Hemoglobin: 14.7 g/dL (ref 11.1–15.9)
Immature Grans (Abs): 0 10*3/uL (ref 0.0–0.1)
Immature Granulocytes: 0 %
Lymphocytes Absolute: 1.3 10*3/uL (ref 0.7–3.1)
Lymphs: 20 %
MCH: 33.9 pg — ABNORMAL HIGH (ref 26.6–33.0)
MCHC: 34.9 g/dL (ref 31.5–35.7)
MCV: 97 fL (ref 79–97)
Monocytes Absolute: 0.5 10*3/uL (ref 0.1–0.9)
Monocytes: 7 %
Neutrophils Absolute: 4.3 10*3/uL (ref 1.4–7.0)
Neutrophils: 63 %
Platelets: 249 10*3/uL (ref 150–450)
RBC: 4.34 x10E6/uL (ref 3.77–5.28)
RDW: 13.4 % (ref 11.7–15.4)
WBC: 6.7 10*3/uL (ref 3.4–10.8)

## 2022-08-11 LAB — COMPREHENSIVE METABOLIC PANEL
ALT: 25 IU/L (ref 0–32)
AST: 24 IU/L (ref 0–40)
Albumin/Globulin Ratio: 1.6 (ref 1.2–2.2)
Albumin: 4.1 g/dL (ref 3.9–4.9)
Alkaline Phosphatase: 79 IU/L (ref 44–121)
BUN/Creatinine Ratio: 12 (ref 9–23)
BUN: 9 mg/dL (ref 6–24)
Bilirubin Total: 0.4 mg/dL (ref 0.0–1.2)
CO2: 22 mmol/L (ref 20–29)
Calcium: 9.3 mg/dL (ref 8.7–10.2)
Chloride: 105 mmol/L (ref 96–106)
Creatinine, Ser: 0.75 mg/dL (ref 0.57–1.00)
Globulin, Total: 2.6 g/dL (ref 1.5–4.5)
Glucose: 109 mg/dL — ABNORMAL HIGH (ref 70–99)
Potassium: 4.1 mmol/L (ref 3.5–5.2)
Sodium: 144 mmol/L (ref 134–144)
Total Protein: 6.7 g/dL (ref 6.0–8.5)
eGFR: 99 mL/min/{1.73_m2} (ref >59)

## 2022-08-11 LAB — HEPATITIS C ANTIBODY: Hep C Virus Ab: NONREACTIVE

## 2022-08-11 LAB — B12 AND FOLATE PANEL
Folate: 5.1 ng/mL (ref >3.0)
Vitamin B-12: 566 pg/mL (ref 232–1245)

## 2022-08-11 LAB — HIV ANTIBODY (ROUTINE TESTING W REFLEX): HIV Screen 4th Generation wRfx: NONREACTIVE

## 2022-08-11 LAB — LIPID PANEL
Chol/HDL Ratio: 3.7 ratio (ref 0.0–4.4)
Cholesterol, Total: 220 mg/dL — ABNORMAL HIGH (ref 100–199)
HDL: 59 mg/dL (ref >39)
LDL Chol Calc (NIH): 131 mg/dL — ABNORMAL HIGH (ref 0–99)
Triglycerides: 169 mg/dL — ABNORMAL HIGH (ref 0–149)
VLDL Cholesterol Cal: 30 mg/dL (ref 5–40)

## 2022-08-11 LAB — VITAMIN D 25 HYDROXY (VIT D DEFICIENCY, FRACTURES): Vit D, 25-Hydroxy: 16.1 ng/mL — ABNORMAL LOW (ref 30.0–100.0)

## 2022-08-11 LAB — TSH+FREE T4
Free T4: 1.45 ng/dL (ref 0.82–1.77)
TSH: 4.59 u[IU]/mL — ABNORMAL HIGH (ref 0.450–4.500)

## 2022-08-11 LAB — HEMOGLOBIN A1C
Est. average glucose Bld gHb Est-mCnc: 100 mg/dL
Hgb A1c MFr Bld: 5.1 % (ref 4.8–5.6)

## 2022-08-11 MED ORDER — LEVOTHYROXINE SODIUM 112 MCG PO TABS: 112.0000 ug | ORAL_TABLET | Freq: Every day | ORAL | 0 refills | Status: DC

## 2022-08-11 NOTE — Progress Notes (Signed)
Thyroid is now under treated. Given your concerns; recommend dose change of synthroid.   Vit D is also low; recommend use of 5,000 IU Vit D3 as supplement daily.   Cholesterol is increased. I continue to recommend diet low in saturated fat and regular exercise - 30 min at least 5 times per week  The 10-year ASCVD risk score (Arnett DK, et al., 2019) is: 1.6%   Values used to calculate the score:     Age: 47 years     Sex: Female     Is Non-Hispanic African American: No     Diabetic: No     Tobacco smoker: No     Systolic Blood Pressure: Q000111Q mmHg     Is BP treated: No     HDL Cholesterol: 59 mg/dL     Total Cholesterol: 220 mg/dL

## 2022-08-23 ENCOUNTER — Encounter: Payer: Self-pay | Admitting: Gastroenterology

## 2022-08-24 ENCOUNTER — Encounter
Admission: RE | Disposition: A | Discharge status: Home / Self Care | Payer: Self-pay | Source: Home / Self Care | Attending: Gastroenterology

## 2022-08-24 ENCOUNTER — Ambulatory Visit
Admission: RE | Admit: 2022-08-24 | Discharge: 2022-08-24 | Disposition: A | Discharge status: Home / Self Care | Payer: BC Managed Care – PPO | Attending: Gastroenterology | Admitting: Gastroenterology

## 2022-08-24 ENCOUNTER — Ambulatory Visit: Payer: BC Managed Care – PPO | Admitting: Certified Registered"

## 2022-08-24 ENCOUNTER — Encounter: Payer: Self-pay | Admitting: Gastroenterology

## 2022-08-24 DIAGNOSIS — E890 Postprocedural hypothyroidism: Secondary | ICD-10-CM | POA: Insufficient documentation

## 2022-08-24 DIAGNOSIS — Z79899 Other long term (current) drug therapy: Secondary | ICD-10-CM | POA: Diagnosis not present

## 2022-08-24 DIAGNOSIS — Z1211 Encounter for screening for malignant neoplasm of colon: Secondary | ICD-10-CM | POA: Diagnosis not present

## 2022-08-24 DIAGNOSIS — Z87891 Personal history of nicotine dependence: Secondary | ICD-10-CM | POA: Diagnosis not present

## 2022-08-24 DIAGNOSIS — K6389 Other specified diseases of intestine: Secondary | ICD-10-CM | POA: Diagnosis not present

## 2022-08-24 HISTORY — PX: COLONOSCOPY WITH PROPOFOL: SHX5780

## 2022-08-24 SURGERY — COLONOSCOPY WITH PROPOFOL
Anesthesia: General

## 2022-08-24 MED ORDER — SODIUM CHLORIDE 0.9 % IV SOLN: INTRAVENOUS | Status: DC

## 2022-08-24 MED ORDER — LIDOCAINE HCL (PF) 2 % IJ SOLN
INTRAMUSCULAR | Status: DC | PRN
  Administered 2022-08-24: 40 mg via INTRADERMAL

## 2022-08-24 MED ORDER — PROPOFOL 10 MG/ML IV BOLUS
INTRAVENOUS | Status: DC | PRN
  Administered 2022-08-24: 50 mg via INTRAVENOUS
  Administered 2022-08-24 (×2): 20 mg via INTRAVENOUS

## 2022-08-24 NOTE — Op Note (Signed)
[facility] Gastroenterology Patient Name: [name] Procedure Date: 08/24/2022 10:52 AM MRN: [id number] Account #: [id number] Date of Birth: [birth year] Admit Type: Outpatient Age: 47 Room: [facility] ENDO ROOM 3 Gender: Female Note Status: Finalized Instrument Name: Nelda Marseille [id number] Procedure:             Colonoscopy Indications:           Screening for colorectal malignant neoplasm Providers:             [name] MD, MD Medicines:             Monitored Anesthesia Care Complications:         No immediate complications. Procedure:             Pre-Anesthesia Assessment:                        - Prior to the procedure, a History and Physical was                         performed, and patient medications, allergies and                         sensitivities were reviewed. The patient's tolerance                         of previous anesthesia was reviewed.                        - The risks and benefits of the procedure and the                         sedation options and risks were discussed with the                         patient. All questions were answered and informed                         consent was obtained.                        - ASA Grade Assessment: II - A patient with mild                         systemic disease.                        After obtaining informed consent, the colonoscope was                         passed under direct vision. Throughout the procedure,                         the patient's blood pressure, pulse, and oxygen                         saturations were monitored continuously. The                         Colonoscope was introduced through the anus and  advanced to the the cecum, identified by the                         appendiceal orifice. The colonoscopy was performed                         with ease. The patient tolerated the procedure well.                         The quality of the bowel  preparation was excellent.                         The ileocecal valve, appendiceal orifice, and rectum                         were photographed. Findings:      The perianal and digital rectal examinations were normal.      A diffuse area of granular mucosa was found in the ascending colon.       Biopsies were taken with a cold forceps for histology.      The exam was otherwise without abnormality on direct and retroflexion       views. Impression:            - Granularity in the ascending colon. Biopsied.                        - The examination was otherwise normal on direct and                         retroflexion views. Recommendation:        - Discharge patient to home (with escort).                        - Resume previous diet.                        - Continue present medications.                        - Await pathology results.                        - Repeat colonoscopy in 10 years for screening                         purposes. Procedure Code(s):     --- Professional ---                        [phone], Colonoscopy, flexible; with biopsy, single or                         multiple Diagnosis Code(s):     --- Professional ---                        Z12.11, Encounter for screening for malignant neoplasm                         of colon  K63.89, Other specified diseases of intestine CPT copyright 2022 American Medical Association. All rights reserved. The codes documented in this report are preliminary and upon coder review may  be revised to meet current compliance requirements. [name], MD [name] MD, MD 08/24/2022 11:13:34 AM This report has been signed electronically. Number of Addenda: 0 Note Initiated On: 08/24/2022 10:52 AM Scope Withdrawal Time: 0 hours 6 minutes 59 seconds  Total Procedure Duration: 0 hours 9 minutes 41 seconds  Estimated Blood Loss:  Estimated blood loss: none.      [facility]

## 2022-08-24 NOTE — Transfer of Care (Signed)
Immediate Anesthesia Transfer of Care Note  Patient: [name]  Procedure(s) Performed: COLONOSCOPY WITH PROPOFOL  Patient Location: PACU and Endoscopy Unit  Anesthesia Type:MAC  Level of Consciousness: awake  Airway & Oxygen Therapy: Patient Spontanous Breathing and Patient connected to nasal cannula oxygen  Post-op Assessment: Report given to RN and Post -op Vital signs reviewed and stable  Post vital signs: Reviewed and stable  Last Vitals:  Vitals Value Taken Time  BP    Temp    Pulse    Resp    SpO2      Last Pain:  Vitals:   08/24/22 1031  TempSrc: Temporal  PainSc: 0-No pain         Complications: No notable events documented.

## 2022-08-24 NOTE — H&P (Signed)
[name], MD [address], [place], [place], [address] [address], [place], [place], [address] Phone: [phone]  Fax: [phone]  Primary Care Physician:  Jacky Kindle, FNP   Pre-Procedure History & Physical: HPI:  [name] is a 47 y.o. female is here for an colonoscopy.   Past Medical History:  Diagnosis Date   Dysmenorrhea    History of goiter    multinodalar thyroid goiter--  s/p  total thyroidectomy---  per path,  Fibrotic adenomatous nodule    History of hyperthyroidism    RAI tx March 2015   Hyperthyroidism    Hypothyroidism, postradioiodine therapy    Menorrhagia    Uterus, adenomyosis     Past Surgical History:  Procedure Laterality Date   ABDOMINAL HYSTERECTOMY  2017   CESAREAN SECTION  07/17/2006   LAPAROSCOPIC TUBAL LIGATION  08/23/2006   w/  fulgeration billaterlly   THYROIDECTOMY N/A 08/19/2014   Procedure: TOTAL THYROIDECTOMY;  Surgeon: Axel Filler, MD;  Location: [facility] OR;  Service: General;  Laterality: N/A;   TUBAL LIGATION      Prior to Admission medications   Medication Sig Start Date End Date Taking? Authorizing Provider  levothyroxine (SYNTHROID) 112 MCG tablet Take 1 tablet (112 mcg total) by mouth daily. 08/11/22  Yes Jacky Kindle, FNP  loratadine (CLARITIN) 10 MG tablet Take 10 mg by mouth 2 (two) times daily.   Yes [provider]  PARoxetine (PAXIL) 10 MG tablet Take 1 tablet (10 mg total) by mouth daily. 08/10/22  Yes Merita Norton T, FNP  estradiol (ESTRACE) 0.5 MG tablet TAKE 1 TABLET BY MOUTH EVERY DAY 01/13/22   Bosie Clos, MD    Allergies as of 08/11/2022 - Review Complete 08/10/2022  Allergen Reaction Noted   Allegra [fexofenadine] Rash 08/06/2014    Family History  Problem Relation Age of Onset   Diabetes Father    Heart disease Father    Hypertension Father    Cancer Maternal Grandmother    Colon cancer Paternal Grandmother    Ulcerative colitis Paternal Grandmother      Social History   Socioeconomic History   Marital status: Married    Spouse name: Not on file   Number of children: Not on file   Years of education: Not on file   Highest education level: Not on file  Occupational History   Not on file  Tobacco Use   Smoking status: Former    Packs/day: 1.00    Years: 10.00    Additional pack years: 0.00    Total pack years: 10.00    Types: Cigarettes   Smokeless tobacco: Never   Tobacco comments:    stopped 07/16/2018  Vaping Use   Vaping Use: Never used  Substance and Sexual Activity   Alcohol use: Yes    Alcohol/week: 6.0 standard drinks of alcohol    Types: 6 Standard drinks or equivalent per week    Comment: weekends-- 3 to 4   Drug use: No   Sexual activity: Not on file  Other Topics Concern   Not on file  Social History Narrative   Not on file   Social Determinants of Health   Financial Resource Strain: Not on file  Food Insecurity: Not on file  Transportation Needs: Not on file  Physical Activity: Not on file  Stress: Not on file  Social Connections: Not on file  Intimate Partner Violence: Not on file    Review of  Systems: See HPI, otherwise negative ROS  Physical Exam: BP 119/89   Pulse 96   Temp (!) 96 F (35.6 C) (Temporal)   Wt 74.8 kg   LMP 01/23/2015   SpO2 98%   BMI 29.23 kg/m  General:   Alert,  pleasant and cooperative in NAD Head:  Normocephalic and atraumatic. Neck:  Supple; no masses or thyromegaly. Lungs:  Clear throughout to auscultation, normal respiratory effort.    Heart:  +S1, +S2, Regular rate and rhythm, No edema. Abdomen:  Soft, nontender and nondistended. Normal bowel sounds, without guarding, and without rebound.   Neurologic:  Alert and  oriented x4;  grossly normal neurologically.  Impression/Plan: NYA MONDS is here for an colonoscopy to be performed for Screening colonoscopy average risk   Risks, benefits, limitations, and alternatives regarding  colonoscopy have been  reviewed with the patient.  Questions have been answered.  All parties agreeable.   [name], MD  08/24/2022, 10:33 AM

## 2022-08-24 NOTE — Anesthesia Preprocedure Evaluation (Signed)
Anesthesia Evaluation  Patient identified by MRN, date of birth, ID band Patient awake    Reviewed: Allergy & Precautions, NPO status , Patient's Chart, lab work & pertinent test results  Airway Mallampati: II  TM Distance: >3 FB Neck ROM: full    Dental  (+) Teeth Intact   Pulmonary neg pulmonary ROS, Patient abstained from smoking., former smoker   Pulmonary exam normal breath sounds clear to auscultation       Cardiovascular negative cardio ROS Normal cardiovascular exam Rhythm:Regular Rate:Normal     Neuro/Psych negative neurological ROS  negative psych ROS   GI/Hepatic negative GI ROS, Neg liver ROS,,,  Endo/Other  negative endocrine ROSHypothyroidism    Renal/GU negative Renal ROS  negative genitourinary   Musculoskeletal negative musculoskeletal ROS (+)    Abdominal Normal abdominal exam  (+)   Peds negative pediatric ROS (+)  Hematology negative hematology ROS (+)   Anesthesia Other Findings Past Medical History: No date: Dysmenorrhea No date: History of goiter     Comment:  multinodalar thyroid goiter--  s/p  total               thyroidectomy---  per path,  Fibrotic adenomatous nodule  No date: History of hyperthyroidism     Comment:  RAI tx March 2015 No date: Hyperthyroidism No date: Hypothyroidism, postradioiodine therapy No date: Menorrhagia No date: Uterus, adenomyosis  Past Surgical History: 2017: ABDOMINAL HYSTERECTOMY 07/17/2006: CESAREAN SECTION 08/23/2006: LAPAROSCOPIC TUBAL LIGATION     Comment:  w/  fulgeration billaterlly 08/19/2014: THYROIDECTOMY; N/A     Comment:  Procedure: TOTAL THYROIDECTOMY;  Surgeon: Axel Filler, MD;  Location: [facility] OR;  Service: General;                Laterality: N/A; No date: TUBAL LIGATION  BMI    Body Mass Index: 29.23 kg/m      Reproductive/Obstetrics negative OB ROS                              Anesthesia Physical Anesthesia Plan  ASA: 2  Anesthesia Plan: General   Post-op Pain Management:    Induction: Intravenous  PONV Risk Score and Plan: Propofol infusion and TIVA  Airway Management Planned: Natural Airway  Additional Equipment:   Intra-op Plan:   Post-operative Plan:   Informed Consent: I have reviewed the patients History and Physical, chart, labs and discussed the procedure including the risks, benefits and alternatives for the proposed anesthesia with the patient or authorized representative who has indicated his/her understanding and acceptance.     Dental Advisory Given  Plan Discussed with: CRNA and Surgeon  Anesthesia Plan Comments:        Anesthesia Quick Evaluation

## 2022-08-24 NOTE — Anesthesia Postprocedure Evaluation (Signed)
Anesthesia Post Note  Patient: [name]  Procedure(s) Performed: COLONOSCOPY WITH PROPOFOL  Patient location during evaluation: PACU Anesthesia Type: General Level of consciousness: awake and awake and alert Pain management: satisfactory to patient Vital Signs Assessment: post-procedure vital signs reviewed and stable Respiratory status: nonlabored ventilation and respiratory function stable Cardiovascular status: blood pressure returned to baseline Anesthetic complications: no   No notable events documented.   Last Vitals:  Vitals:   08/24/22 1114 08/24/22 1124  BP: (!) 136/107 (!) 144/102  Pulse: 90 64  Resp: 13 (!) 22  Temp: (!) (P) 35.8 C   SpO2: 99% 99%    Last Pain:  Vitals:   08/24/22 1124  TempSrc:   PainSc: 0-No pain                 VAN STAVEREN,Carlyn Lemke

## 2022-08-25 ENCOUNTER — Encounter: Payer: Self-pay | Admitting: Gastroenterology

## 2022-08-25 LAB — SURGICAL PATHOLOGY

## 2022-08-26 NOTE — Progress Notes (Signed)
Inform bx showed mild inflammation likely from the bowel prep repeat colonoscopy in 10 years

## 2022-09-02 ENCOUNTER — Other Ambulatory Visit: Payer: Self-pay | Admitting: Family Medicine

## 2022-09-14 ENCOUNTER — Encounter: Payer: Self-pay | Admitting: Gastroenterology

## 2022-10-13 ENCOUNTER — Ambulatory Visit
Admission: RE | Admit: 2022-10-13 | Discharge: 2022-10-13 | Disposition: A | Discharge status: Home / Self Care | Payer: BC Managed Care – PPO | Source: Ambulatory Visit | Attending: Family Medicine | Admitting: Family Medicine

## 2022-10-13 DIAGNOSIS — Z1231 Encounter for screening mammogram for malignant neoplasm of breast: Secondary | ICD-10-CM | POA: Diagnosis not present

## 2022-10-14 NOTE — Progress Notes (Signed)
[place]  Normal mammogram; repeat in 1 year.  Please let us know if you have any questions.  Thank you,  Moneisha Vosler, FNP 

## 2022-12-13 ENCOUNTER — Other Ambulatory Visit: Payer: Self-pay | Admitting: Family Medicine

## 2022-12-23 ENCOUNTER — Encounter: Payer: BC Managed Care – PPO | Admitting: Family Medicine

## 2023-01-11 ENCOUNTER — Ambulatory Visit (INDEPENDENT_AMBULATORY_CARE_PROVIDER_SITE_OTHER): Payer: BC Managed Care – PPO | Admitting: Family Medicine

## 2023-01-11 ENCOUNTER — Encounter: Payer: Self-pay | Admitting: Family Medicine

## 2023-01-11 ENCOUNTER — Other Ambulatory Visit (HOSPITAL_COMMUNITY)
Admission: RE | Admit: 2023-01-11 | Discharge: 2023-01-11 | Disposition: A | Discharge status: Home / Self Care | Payer: BC Managed Care – PPO | Source: Ambulatory Visit | Attending: Family Medicine | Admitting: Family Medicine

## 2023-01-11 VITALS — BP 129/90 | HR 108 | Temp 97.6°F | Resp 12 | Ht 63.0 in | Wt 175.9 lb

## 2023-01-11 DIAGNOSIS — I10 Essential (primary) hypertension: Secondary | ICD-10-CM | POA: Diagnosis not present

## 2023-01-11 DIAGNOSIS — Z0000 Encounter for general adult medical examination without abnormal findings: Secondary | ICD-10-CM

## 2023-01-11 DIAGNOSIS — Z124 Encounter for screening for malignant neoplasm of cervix: Secondary | ICD-10-CM | POA: Diagnosis not present

## 2023-01-11 DIAGNOSIS — Z833 Family history of diabetes mellitus: Secondary | ICD-10-CM | POA: Diagnosis not present

## 2023-01-11 DIAGNOSIS — E890 Postprocedural hypothyroidism: Secondary | ICD-10-CM | POA: Diagnosis not present

## 2023-01-11 DIAGNOSIS — Z83438 Family history of other disorder of lipoprotein metabolism and other lipidemia: Secondary | ICD-10-CM

## 2023-01-11 DIAGNOSIS — Z6831 Body mass index (BMI) 31.0-31.9, adult: Secondary | ICD-10-CM

## 2023-01-11 DIAGNOSIS — E559 Vitamin D deficiency, unspecified: Secondary | ICD-10-CM

## 2023-01-11 DIAGNOSIS — Z1331 Encounter for screening for depression: Secondary | ICD-10-CM

## 2023-01-11 NOTE — Progress Notes (Signed)
Complete physical exam  Patient: [name]   DOB: June 07, 1975   47 y.o. Female  MRN: [id number] Visit Date: 01/11/2023  Today's healthcare provider: Jacky Kindle, FNP  Re-introduced to nurse practitioner role and practice setting.  All questions answered.  Discussed provider/patient relationship and expectations.  Chief Complaint  Patient presents with   Annual Exam   Subjective    [name] is a 47 y.o. female who presents today for a complete physical exam.  She reports consuming a general diet. The patient does not participate in regular exercise at present. She generally feels well. She reports sleeping well. She does have additional problems to discuss today.  HPI   Request for PAP  Past Medical History:  Diagnosis Date   Dysmenorrhea    History of goiter    multinodalar thyroid goiter--  s/p  total thyroidectomy---  per path,  Fibrotic adenomatous nodule    History of hyperthyroidism    RAI tx March 2015   Hyperthyroidism    Hypothyroidism, postradioiodine therapy    Menorrhagia    Uterus, adenomyosis    Past Surgical History:  Procedure Laterality Date   ABDOMINAL HYSTERECTOMY  2017   CESAREAN SECTION  07/17/2006   COLONOSCOPY WITH PROPOFOL N/A 08/24/2022   Procedure: COLONOSCOPY WITH PROPOFOL;  Surgeon: [name], MD;  Location: [facility] ENDOSCOPY;  Service: Gastroenterology;  Laterality: N/A;   LAPAROSCOPIC TUBAL LIGATION  08/23/2006   w/  fulgeration billaterlly   THYROIDECTOMY N/A 08/19/2014   Procedure: TOTAL THYROIDECTOMY;  Surgeon: Axel Filler, MD;  Location: [facility] OR;  Service: General;  Laterality: N/A;   TUBAL LIGATION     Social History   Socioeconomic History   Marital status: Married    Spouse name: Not on file   Number of children: Not on file   Years of education: Not on file   Highest education level: Not on file  Occupational History   Not on file  Tobacco Use   Smoking status: Former    Current packs/day: 1.00     Average packs/day: 1 pack/day for 10.0 years (10.0 ttl pk-yrs)    Types: Cigarettes   Smokeless tobacco: Never   Tobacco comments:    stopped 07/16/2018  Vaping Use   Vaping status: Never Used  Substance and Sexual Activity   Alcohol use: Yes    Alcohol/week: 6.0 standard drinks of alcohol    Types: 6 Standard drinks or equivalent per week    Comment: weekends-- 3 to 4   Drug use: No   Sexual activity: Not on file  Other Topics Concern   Not on file  Social History Narrative   Not on file   Social Determinants of Health   Financial Resource Strain: Not on file  Food Insecurity: Not on file  Transportation Needs: Not on file  Physical Activity: Not on file  Stress: Not on file  Social Connections: Not on file  Intimate Partner Violence: Not on file   Family Status  Relation Name Status   Father  Deceased   MGM  (Not Specified)   Mother  Alive   Brother  Alive   PGM  (Not Specified)  No partnership data on file   Family History  Problem Relation Age of Onset   Diabetes Father    Heart disease Father    Hypertension Father    Cancer Maternal Grandmother    Colon cancer Paternal Grandmother    Ulcerative colitis Paternal Grandmother  Allergies  Allergen Reactions   Allegra [Fexofenadine] Rash    Patient Care Team: Jacky Kindle, FNP as PCP - General (Family Medicine)   Medications: Outpatient Medications Prior to Visit  Medication Sig   levothyroxine (SYNTHROID) 112 MCG tablet TAKE 1 TABLET BY MOUTH EVERY DAY   [DISCONTINUED] estradiol (ESTRACE) 0.5 MG tablet TAKE 1 TABLET BY MOUTH EVERY DAY (Patient not taking: Reported on 01/11/2023)   [DISCONTINUED] loratadine (CLARITIN) 10 MG tablet Take 10 mg by mouth 2 (two) times daily. (Patient not taking: Reported on 01/11/2023)   [DISCONTINUED] PARoxetine (PAXIL) 10 MG tablet TAKE 1 TABLET BY MOUTH EVERY DAY (Patient not taking: Reported on 01/11/2023)   No facility-administered medications prior to visit.    Objective     BP (!) 129/90 (BP Location: Left Arm, Patient Position: Sitting, Cuff Size: Normal)   Pulse (!) 108   Temp 97.6 F (36.4 C) (Temporal)   Resp 12   Ht 5\' 3"  (1.6 m)   Wt 175 lb 14.4 oz (79.8 kg)   LMP 01/23/2015   SpO2 99%   BMI 31.16 kg/m   Physical Exam Vitals and nursing note reviewed.  Constitutional:      General: She is awake. She is not in acute distress.    Appearance: Normal appearance. She is well-developed, well-groomed and normal weight. She is not ill-appearing, toxic-appearing or diaphoretic.  HENT:     Head: Normocephalic and atraumatic.     Jaw: There is normal jaw occlusion. No trismus, tenderness, swelling or pain on movement.     Right Ear: Hearing, tympanic membrane, ear canal and external ear normal. There is no impacted cerumen.     Left Ear: Hearing, tympanic membrane, ear canal and external ear normal. There is no impacted cerumen.     Nose: Nose normal. No congestion or rhinorrhea.     Right Turbinates: Not enlarged, swollen or pale.     Left Turbinates: Not enlarged, swollen or pale.     Right Sinus: No maxillary sinus tenderness or frontal sinus tenderness.     Left Sinus: No maxillary sinus tenderness or frontal sinus tenderness.     Mouth/Throat:     Lips: Pink.     Mouth: Mucous membranes are moist. No injury.     Tongue: No lesions.     Pharynx: Oropharynx is clear. Uvula midline. No pharyngeal swelling, oropharyngeal exudate, posterior oropharyngeal erythema or uvula swelling.     Tonsils: No tonsillar exudate or tonsillar abscesses.  Eyes:     General: Lids are normal. Lids are everted, no foreign bodies appreciated. Vision grossly intact. Gaze aligned appropriately. No allergic shiner or visual field deficit.       Right eye: No discharge.        Left eye: No discharge.     Extraocular Movements: Extraocular movements intact.     Conjunctiva/sclera: Conjunctivae normal.     Right eye: Right conjunctiva is not injected. No exudate.    Left  eye: Left conjunctiva is not injected. No exudate.    Pupils: Pupils are equal, round, and reactive to light.  Neck:     Thyroid: No thyroid mass, thyromegaly or thyroid tenderness.     Vascular: No carotid bruit.     Trachea: Trachea normal.  Cardiovascular:     Rate and Rhythm: Normal rate and regular rhythm.     Pulses: Normal pulses.          Carotid pulses are 2+ on the right side and 2+  on the left side.      Radial pulses are 2+ on the right side and 2+ on the left side.       Dorsalis pedis pulses are 2+ on the right side and 2+ on the left side.       Posterior tibial pulses are 2+ on the right side and 2+ on the left side.     Heart sounds: Normal heart sounds, S1 normal and S2 normal. No murmur heard.    No friction rub. No gallop.  Pulmonary:     Effort: Pulmonary effort is normal. No respiratory distress.     Breath sounds: Normal breath sounds and air entry. No stridor. No wheezing, rhonchi or rales.  Chest:     Chest wall: No tenderness.     Comments: Breasts: breasts appear normal, no suspicious masses, no skin or nipple changes or axillary nodes, symmetric fibrous changes in both upper outer quadrants, right breast normal without mass, skin or nipple changes or axillary nodes, left breast normal without mass, skin or nipple changes or axillary nodes, risk and benefit of breast self-exam was discussed  Abdominal:     General: Abdomen is flat. Bowel sounds are normal. There is no distension.     Palpations: Abdomen is soft. There is no mass.     Tenderness: There is no abdominal tenderness. There is no right CVA tenderness, left CVA tenderness, guarding or rebound.     Hernia: No hernia is present.  Genitourinary:    General: Normal vulva.     Exam position: Lithotomy position.     Tanner stage (genital): 5.     Vagina: Vaginal discharge present.     Comments: Deferred internal exam; cervix surgically absent Declines STI screening Musculoskeletal:        General: No  swelling, tenderness, deformity or signs of injury. Normal range of motion.     Cervical back: Full passive range of motion without pain, normal range of motion and neck supple. No edema, rigidity or tenderness. No muscular tenderness.     Right lower leg: No edema.     Left lower leg: No edema.  Lymphadenopathy:     Cervical: No cervical adenopathy.     Right cervical: No superficial, deep or posterior cervical adenopathy.    Left cervical: No superficial, deep or posterior cervical adenopathy.  Skin:    General: Skin is warm and dry.     Capillary Refill: Capillary refill takes less than 2 seconds.     Coloration: Skin is not jaundiced or pale.     Findings: No bruising, erythema, lesion or rash.  Neurological:     General: No focal deficit present.     Mental Status: She is alert and oriented to person, place, and time. Mental status is at baseline.     GCS: GCS eye subscore is 4. GCS verbal subscore is 5. GCS motor subscore is 6.     Sensory: Sensation is intact. No sensory deficit.     Motor: Motor function is intact. No weakness.     Coordination: Coordination is intact. Coordination normal.     Gait: Gait is intact. Gait normal.  Psychiatric:        Attention and Perception: Attention and perception normal.        Mood and Affect: Mood and affect normal.        Speech: Speech normal.        Behavior: Behavior normal. Behavior is cooperative.  Thought Content: Thought content normal.        Cognition and Memory: Cognition and memory normal.        Judgment: Judgment normal.       Last depression screening scores    01/11/2023    3:47 PM 08/10/2022    8:34 AM 12/21/2021    9:10 AM  PHQ 2/9 Scores  PHQ - 2 Score 0 0 0  PHQ- 9 Score 6 4 4    Last fall risk screening    01/11/2023    3:47 PM  Fall Risk   Falls in the past year? 0  Number falls in past yr: 0  Injury with Fall? 0  Risk for fall due to : No Fall Risks  Follow up Falls evaluation completed   Last  Audit-C alcohol use screening    08/10/2022    8:34 AM  Alcohol Use Disorder Test (AUDIT)  1. How often do you have a drink containing alcohol? 2  2. How many drinks containing alcohol do you have on a typical day when you are drinking? 1  3. How often do you have six or more drinks on one occasion? 1  AUDIT-C Score 4   A score of 3 or more in women, and 4 or more in men indicates increased risk for alcohol abuse, EXCEPT if all of the points are from question 1   No results found for any visits on 01/11/23.  Assessment & Plan    Routine Health Maintenance and Physical Exam  Exercise Activities and Dietary recommendations  Goals   None     Immunization History  Administered Date(s) Administered   Tdap 08/23/2013    Health Maintenance  Topic Date Due   COVID-19 Vaccine (1 - 2023-24 season) Never done   INFLUENZA VACCINE  08/07/2023 (Originally 12/08/2022)   DTaP/Tdap/Td (2 - Td or Tdap) 08/24/2023   PAP SMEAR-Modifier  07/23/2024   Colonoscopy  08/23/2032   Hepatitis C Screening  Completed   HIV Screening  Completed   HPV VACCINES  Aged Out    Discussed health benefits of physical activity, and encouraged her to engage in regular exercise appropriate for her age and condition.  Problem List Items Addressed This Visit       Cardiovascular and Mediastinum   Primary hypertension    Chronic, untreated Patient reports white coat HTN; however, no additional logs or BP machines available for comparison Goal of <140/<90 Continue to monitor diet, exercise and obesity to assist Continues to decline use of oral antihypertensives to assist with risk reduction of heart attack and/or stroke      Relevant Orders   CBC with Differential/Platelet   Comprehensive Metabolic Panel (CMET)   TSH     Endocrine   Hypothyroidism    Chronic, endorses fatigue and weight gain Repeat labs Previously controlled with use 112 synthroid       Relevant Orders   TSH     Other   Annual  physical exam - Primary    Concerns for ongoing weight gain despite calorie deficit and full time work Things to do to keep yourself healthy  - Exercise at least 30-45 minutes a day, 3-4 days a week.  - Eat a low-fat diet with lots of fruits and vegetables, up to 7-9 servings per day.  - Seatbelts can save your life. Wear them always.  - Smoke detectors on every level of your home, check batteries every year.  - Eye Doctor - have an eye  exam every 1-2 years  - Safe sex - if you may be exposed to STDs, use a condom.  - Alcohol -  If you drink, do it moderately, less than 2 drinks per day.  - Health Care Power of Attorney. Choose someone to speak for you if you are not able.  - Depression is common in our stressful world.If you're feeling down or losing interest in things you normally enjoy, please come in for a visit.  - Violence - If anyone is threatening or hurting you, please call immediately.       Relevant Orders   CBC with Differential/Platelet   Comprehensive Metabolic Panel (CMET)   TSH   Lipid panel   Hemoglobin A1c   Cytology - PAP   Vitamin D (25 hydroxy)   Avitaminosis D    Chronic, concerns for weight gain Repeat labs Previously borderline low      Relevant Orders   Vitamin D (25 hydroxy)   BMI 31.0-31.9,adult    Chronic, with 10 lb weight gain Body mass index is 31.16 kg/m. Discussed importance of healthy weight management Discussed diet and exercise       Family history of combined hyperlipidemia    Chronic; repeat LP Continue to recommend rdiet low in saturated fat and regular exercise - 30 min at least 5 times per week The 10-year ASCVD risk score (Arnett DK, et al., 2019) is: 1%        Relevant Orders   Lipid panel   Family history of diabetes mellitus in father    Recommend A1c given weight gain and obesity as well as family hx Continue to recommend balanced, lower carb meals. Smaller meal size, adding snacks. Choosing water as drink of choice  and increasing purposeful exercise.       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    Leilani Merl, FNP, have reviewed all documentation for this visit. The documentation on 01/11/23 for the exam, diagnosis, procedures, and orders are all accurate and complete.  Jacky Kindle, FNP  [facility] [phone] (phone) [phone] (fax)  [facility]

## 2023-01-11 NOTE — Assessment & Plan Note (Signed)
Chronic; repeat LP Continue to recommend rdiet low in saturated fat and regular exercise - 30 min at least 5 times per week The 10-year ASCVD risk score (Arnett DK, et al., 2019) is: 1%

## 2023-01-11 NOTE — Assessment & Plan Note (Signed)
PAP smear completed from vaginal lining; cervix surgically absent Discharge present; pt denies concerns Also declined STI screening

## 2023-01-11 NOTE — Assessment & Plan Note (Signed)
Recommend A1c given weight gain and obesity as well as family hx Continue to recommend balanced, lower carb meals. Smaller meal size, adding snacks. Choosing water as drink of choice and increasing purposeful exercise.

## 2023-01-11 NOTE — Assessment & Plan Note (Signed)
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

## 2023-01-11 NOTE — Assessment & Plan Note (Signed)
Concerns for ongoing weight gain despite calorie deficit and full time work Things to do to keep yourself healthy  - Exercise at least 30-45 minutes a day, 3-4 days a week.  - Eat a low-fat diet with lots of fruits and vegetables, up to 7-9 servings per day.  - Seatbelts can save your life. Wear them always.  - Smoke detectors on every level of your home, check batteries every year.  - Eye Doctor - have an eye exam every 1-2 years  - Safe sex - if you may be exposed to STDs, use a condom.  - Alcohol -  If you drink, do it moderately, less than 2 drinks per day.  - Health Care Power of Attorney. Choose someone to speak for you if you are not able.  - Depression is common in our stressful world.If you're feeling down or losing interest in things you normally enjoy, please come in for a visit.  - Violence - If anyone is threatening or hurting you, please call immediately.

## 2023-01-11 NOTE — Assessment & Plan Note (Signed)
Chronic, endorses fatigue and weight gain Repeat labs Previously controlled with use 112 synthroid

## 2023-01-11 NOTE — Assessment & Plan Note (Signed)
Chronic, concerns for weight gain Repeat labs Previously borderline low

## 2023-01-11 NOTE — Assessment & Plan Note (Signed)
Chronic, untreated Patient reports white coat HTN; however, no additional logs or BP machines available for comparison Goal of <140/<90 Continue to monitor diet, exercise and obesity to assist Continues to decline use of oral antihypertensives to assist with risk reduction of heart attack and/or stroke

## 2023-01-11 NOTE — Assessment & Plan Note (Signed)
Chronic, with 10 lb weight gain Body mass index is 31.16 kg/m. Discussed importance of healthy weight management Discussed diet and exercise

## 2023-01-12 ENCOUNTER — Encounter: Payer: Self-pay | Admitting: Family Medicine

## 2023-01-12 LAB — CBC WITH DIFFERENTIAL/PLATELET
Basophils Absolute: 0.1 10*3/uL (ref 0.0–0.2)
Basos: 1 %
EOS (ABSOLUTE): 0.5 10*3/uL — ABNORMAL HIGH (ref 0.0–0.4)
Eos: 4 %
Hematocrit: 40.7 % (ref 34.0–46.6)
Hemoglobin: 14.3 g/dL (ref 11.1–15.9)
Immature Grans (Abs): 0 10*3/uL (ref 0.0–0.1)
Immature Granulocytes: 0 %
Lymphocytes Absolute: 2.3 10*3/uL (ref 0.7–3.1)
Lymphs: 21 %
MCH: 33.3 pg — ABNORMAL HIGH (ref 26.6–33.0)
MCHC: 35.1 g/dL (ref 31.5–35.7)
MCV: 95 fL (ref 79–97)
Monocytes Absolute: 0.6 10*3/uL (ref 0.1–0.9)
Monocytes: 6 %
Neutrophils Absolute: 7.2 10*3/uL — ABNORMAL HIGH (ref 1.4–7.0)
Neutrophils: 68 %
Platelets: 304 10*3/uL (ref 150–450)
RBC: 4.29 x10E6/uL (ref 3.77–5.28)
RDW: 12.6 % (ref 11.7–15.4)
WBC: 10.7 10*3/uL (ref 3.4–10.8)

## 2023-01-12 LAB — HEMOGLOBIN A1C
Est. average glucose Bld gHb Est-mCnc: 100 mg/dL
Hgb A1c MFr Bld: 5.1 % (ref 4.8–5.6)

## 2023-01-12 LAB — TSH: TSH: 0.717 u[IU]/mL (ref 0.450–4.500)

## 2023-01-12 LAB — LIPID PANEL
Chol/HDL Ratio: 5.2 ratio — ABNORMAL HIGH (ref 0.0–4.4)
Cholesterol, Total: 239 mg/dL — ABNORMAL HIGH (ref 100–199)
HDL: 46 mg/dL (ref >39)
LDL Chol Calc (NIH): 71 mg/dL (ref 0–99)
Triglycerides: 787 mg/dL (ref 0–149)
VLDL Cholesterol Cal: 122 mg/dL — ABNORMAL HIGH (ref 5–40)

## 2023-01-12 LAB — VITAMIN D 25 HYDROXY (VIT D DEFICIENCY, FRACTURES): Vit D, 25-Hydroxy: 28.7 ng/mL — ABNORMAL LOW (ref 30.0–100.0)

## 2023-01-12 LAB — COMPREHENSIVE METABOLIC PANEL
ALT: 43 IU/L — ABNORMAL HIGH (ref 0–32)
AST: 50 IU/L — ABNORMAL HIGH (ref 0–40)
Albumin: 4.5 g/dL (ref 3.9–4.9)
Alkaline Phosphatase: 98 IU/L (ref 44–121)
BUN/Creatinine Ratio: 17 (ref 9–23)
BUN: 13 mg/dL (ref 6–24)
Bilirubin Total: 0.2 mg/dL (ref 0.0–1.2)
CO2: 15 mmol/L — ABNORMAL LOW (ref 20–29)
Calcium: 9.9 mg/dL (ref 8.7–10.2)
Chloride: 103 mmol/L (ref 96–106)
Creatinine, Ser: 0.77 mg/dL (ref 0.57–1.00)
Globulin, Total: 2.7 g/dL (ref 1.5–4.5)
Glucose: 90 mg/dL (ref 70–99)
Potassium: 3.9 mmol/L (ref 3.5–5.2)
Sodium: 141 mmol/L (ref 134–144)
Total Protein: 7.2 g/dL (ref 6.0–8.5)
eGFR: 96 mL/min/{1.73_m2} (ref >59)

## 2023-01-13 ENCOUNTER — Other Ambulatory Visit: Payer: Self-pay | Admitting: Family Medicine

## 2023-01-13 DIAGNOSIS — Z83438 Family history of other disorder of lipoprotein metabolism and other lipidemia: Secondary | ICD-10-CM

## 2023-01-13 MED ORDER — FENOFIBRATE 145 MG PO TABS
145.0000 mg | ORAL_TABLET | Freq: Every day | ORAL | 0 refills | Status: DC
Start: 2023-01-13 — End: 2023-04-17

## 2023-01-17 LAB — CYTOLOGY - PAP
Comment: NEGATIVE
Diagnosis: NEGATIVE
High risk HPV: NEGATIVE

## 2023-03-06 ENCOUNTER — Telehealth: Payer: Self-pay | Admitting: *Deleted

## 2023-03-06 NOTE — Telephone Encounter (Signed)
LVM for pt to call office, she had left a message to see about setting up appointments. Please assist in getting these scheduled if she calls back.

## 2023-03-13 ENCOUNTER — Other Ambulatory Visit: Payer: Self-pay | Admitting: Family Medicine

## 2023-03-23 ENCOUNTER — Ambulatory Visit: Payer: BC Managed Care – PPO | Admitting: Family Medicine

## 2023-03-23 NOTE — Progress Notes (Deleted)
New Patient Office Visit  Subjective    Patient ID: [name], female    DOB: 1976-02-16  Age: 47 y.o. MRN: [id number]  CC: No chief complaint on file.   HPI [name] presents to establish care ***  PMH: ***  PSH: ***  FH: ***  Tobacco use: *** Alcohol use: *** Drug use: *** Marital status: *** Employment: *** Sexual hx: ***  Screenings:  Colon Cancer: *** Lung Cancer: *** Breast Cancer: *** Diabetes: *** HLD: ***   Outpatient Encounter Medications as of 03/23/2023  Medication Sig   fenofibrate (TRICOR) 145 MG tablet Take 1 tablet (145 mg total) by mouth daily. Repeat a fasting lipid panel following completion.   levothyroxine (SYNTHROID) 112 MCG tablet TAKE 1 TABLET BY MOUTH EVERY DAY   No facility-administered encounter medications on file as of 03/23/2023.    Past Medical History:  Diagnosis Date   Dysmenorrhea    History of goiter    multinodalar thyroid goiter--  s/p  total thyroidectomy---  per path,  Fibrotic adenomatous nodule    History of hyperthyroidism    RAI tx March 2015   Hyperthyroidism    Hypothyroidism, postradioiodine therapy    Menorrhagia    Uterus, adenomyosis     Past Surgical History:  Procedure Laterality Date   ABDOMINAL HYSTERECTOMY  2017   CESAREAN SECTION  07/17/2006   COLONOSCOPY WITH PROPOFOL N/A 08/24/2022   Procedure: COLONOSCOPY WITH PROPOFOL;  Surgeon: [name], MD;  Location: [facility] ENDOSCOPY;  Service: Gastroenterology;  Laterality: N/A;   LAPAROSCOPIC TUBAL LIGATION  08/23/2006   w/  fulgeration billaterlly   THYROIDECTOMY N/A 08/19/2014   Procedure: TOTAL THYROIDECTOMY;  Surgeon: Axel Filler, MD;  Location: [facility] OR;  Service: General;  Laterality: N/A;   TUBAL LIGATION      Family History  Problem Relation Age of Onset   Diabetes Father    Heart disease Father    Hypertension Father    Cancer Maternal Grandmother    Colon cancer Paternal Grandmother    Ulcerative colitis Paternal  Grandmother     Social History   Socioeconomic History   Marital status: Married    Spouse name: Not on file   Number of children: Not on file   Years of education: Not on file   Highest education level: Not on file  Occupational History   Not on file  Tobacco Use   Smoking status: Former    Current packs/day: 1.00    Average packs/day: 1 pack/day for 10.0 years (10.0 ttl pk-yrs)    Types: Cigarettes   Smokeless tobacco: Never   Tobacco comments:    stopped 07/16/2018  Vaping Use   Vaping status: Never Used  Substance and Sexual Activity   Alcohol use: Yes    Alcohol/week: 6.0 standard drinks of alcohol    Types: 6 Standard drinks or equivalent per week    Comment: weekends-- 3 to 4   Drug use: No   Sexual activity: Not on file  Other Topics Concern   Not on file  Social History Narrative   Not on file   Social Determinants of Health   Financial Resource Strain: Not on file  Food Insecurity: Not on file  Transportation Needs: Not on file  Physical Activity: Not on file  Stress: Not on file  Social Connections: Not on file  Intimate Partner Violence: Not on file    ROS      Objective    LMP 01/23/2015  Physical Exam     Assessment & Plan:   There are no diagnoses linked to this encounter.  No follow-ups on file.   Sandre Kitty, MD

## 2023-04-10 ENCOUNTER — Ambulatory Visit: Payer: BC Managed Care – PPO | Admitting: Family Medicine

## 2023-04-10 ENCOUNTER — Encounter: Payer: Self-pay | Admitting: Family Medicine

## 2023-04-10 VITALS — BP 136/92 | HR 90 | Ht 63.0 in | Wt 184.8 lb

## 2023-04-10 DIAGNOSIS — Z6831 Body mass index (BMI) 31.0-31.9, adult: Secondary | ICD-10-CM

## 2023-04-10 DIAGNOSIS — I10 Essential (primary) hypertension: Secondary | ICD-10-CM | POA: Diagnosis not present

## 2023-04-10 DIAGNOSIS — E890 Postprocedural hypothyroidism: Secondary | ICD-10-CM | POA: Diagnosis not present

## 2023-04-10 DIAGNOSIS — E781 Pure hyperglyceridemia: Secondary | ICD-10-CM | POA: Diagnosis not present

## 2023-04-10 DIAGNOSIS — Z7689 Persons encountering health services in other specified circumstances: Secondary | ICD-10-CM | POA: Insufficient documentation

## 2023-04-10 DIAGNOSIS — E559 Vitamin D deficiency, unspecified: Secondary | ICD-10-CM | POA: Diagnosis not present

## 2023-04-10 DIAGNOSIS — N951 Menopausal and female climacteric states: Secondary | ICD-10-CM | POA: Insufficient documentation

## 2023-04-10 MED ORDER — AMLODIPINE BESYLATE 5 MG PO TABS
5.0000 mg | ORAL_TABLET | Freq: Every day | ORAL | 3 refills | Status: DC
Start: 2023-04-10 — End: 2023-07-07

## 2023-04-10 MED ORDER — CITALOPRAM HYDROBROMIDE 10 MG PO TABS
10.0000 mg | ORAL_TABLET | Freq: Every day | ORAL | 3 refills | Status: DC
Start: 2023-04-10 — End: 2023-05-04

## 2023-04-10 NOTE — Assessment & Plan Note (Signed)
Patient agreeable to blood pressure medication.  Has not taken blood pressure medication in the past.  Will start with amlodipine 5 mg.  Recheck in 1 month.

## 2023-04-10 NOTE — Assessment & Plan Note (Signed)
Recheck lipid panel.  Continue Tricor.

## 2023-04-10 NOTE — Patient Instructions (Signed)
It was nice to see you today,  We addressed the following topics today: -I have prescribed amlodipine for your blood pressure. - I have prescribed Celexa, an SSRI, for your hot flashes and other menopausal symptoms.  This can also help with irritability. - Both of these medications are taken daily. - Follow-up with 1 month so we make changes to these medications if necessary - Between now and a month now, if you want you can call your insurance company to ask them what weight loss medications are covered by your plan.  We can then discuss those options at your next visit.  Have a great day,  Frederic Jericho, MD

## 2023-04-10 NOTE — Progress Notes (Unsigned)
New Patient Office Visit  Subjective    Patient ID: [name], female    DOB: 1976-01-05  Age: 47 y.o. MRN: [id number]  CC:  Chief Complaint  Patient presents with   New Patient (Initial Visit)    HPI [name] presents to establish care  Patient was previously seen [place] primary care.  Her physician retired and now she is establishing care here with [place], closer to her home.  She is taking Tricor and Synthroid.  No other medications.  Has an allergy to Allegra.  Patient has been on thyroid medication since around 2015, after surgical thyroidectomy in response to hyperthyroidism.  Hypertension-patient states she has had issues with high blood pressure recently but has never been on a blood pressure medication before.  Patient had a hysterectomy over a decade ago.  Has her left ovary but right ovary was removed.  Hysterectomy was due to heavy menstrual bleeding.  Patient complaining of hot flashes, night sweats, does have some irritability as well.  Was prescribed some medication for these symptoms but this interfered with her work schedule and she did not take it.  She also tried taking hormone therapy but states "this did nothing for her"  PMH: Hypothyroidism status post thyroidectomy, hypertriglyceridemia,  PSH: hysterectomy  FH: father - dm, cad; pgm - colon cancer - 59 yo.    Tobacco use: no  Alcohol use: 3-4/week.  Wine.   Drug use: no Marital status: married.  2 children 16, 24.  No grandchildren.   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    Screenings:  Colon Cancer: a few months ago.  Follow-up in 5 years Breast Cancer: a few months ago.  Normal.     Outpatient Encounter Medications as of 04/10/2023  Medication Sig   amLODipine (NORVASC) 5 MG tablet Take 1 tablet (5 mg total) by mouth daily.   citalopram (CELEXA) 10 MG tablet Take 1 tablet (10 mg total) by  mouth daily.   fenofibrate (TRICOR) 145 MG tablet Take 1 tablet (145 mg total) by mouth daily. Repeat a fasting lipid panel following completion.   levothyroxine (SYNTHROID) 112 MCG tablet TAKE 1 TABLET BY MOUTH EVERY DAY   No facility-administered encounter medications on file as of 04/10/2023.    Past Medical History:  Diagnosis Date   Dysmenorrhea    History of goiter    multinodalar thyroid goiter--  s/p  total thyroidectomy---  per path,  Fibrotic adenomatous nodule    History of hyperthyroidism    RAI tx March 2015   Hyperthyroidism    Hypothyroidism, postradioiodine therapy    Menorrhagia    Uterus, adenomyosis     Past Surgical History:  Procedure Laterality Date   ABDOMINAL HYSTERECTOMY  2017   CESAREAN SECTION  07/17/2006   COLONOSCOPY WITH PROPOFOL N/A 08/24/2022   Procedure: COLONOSCOPY WITH PROPOFOL;  Surgeon: [name], MD;  Location: [facility];  Service: Gastroenterology;  Laterality: N/A;   LAPAROSCOPIC TUBAL LIGATION  08/23/2006   w/  fulgeration billaterlly   THYROIDECTOMY N/A 08/19/2014   Procedure: TOTAL THYROIDECTOMY;  Surgeon: Axel Filler, MD;  Location: [facility] OR;  Service: General;  Laterality: N/A;   TUBAL LIGATION      Family History  Problem Relation Age of Onset   Diabetes Father    Heart disease Father    Hypertension Father    Cancer Maternal Grandmother    Colon cancer  Paternal Grandmother    Ulcerative colitis Paternal Grandmother     Social History   Socioeconomic History   Marital status: Married    Spouse name: Not on file   Number of children: Not on file   Years of education: Not on file   Highest education level: Not on file  Occupational History   Not on file  Tobacco Use   Smoking status: Former    Current packs/day: 1.00    Average packs/day: 1 pack/day for 10.0 years (10.0 ttl pk-yrs)    Types: Cigarettes   Smokeless tobacco: Never   Tobacco comments:    stopped 07/16/2018  Vaping Use   Vaping status: Never  Used  Substance and Sexual Activity   Alcohol use: Yes    Alcohol/week: 6.0 standard drinks of alcohol    Types: 6 Standard drinks or equivalent per week    Comment: weekends-- 3 to 4   Drug use: No   Sexual activity: Not on file  Other Topics Concern   Not on file  Social History Narrative   Not on file   Social Determinants of Health   Financial Resource Strain: Not on file  Food Insecurity: Not on file  Transportation Needs: Not on file  Physical Activity: Not on file  Stress: Not on file  Social Connections: Not on file  Intimate Partner Violence: Not on file    ROS      Objective    BP (!) 136/92   Pulse 90   Ht 5\' 3"  (1.6 m)   Wt 184 lb 12.8 oz (83.8 kg)   LMP 01/23/2015   SpO2 97%   BMI 32.74 kg/m   Physical Exam Gen: alert, oriented.   Heent: perrla, eomi Cv: rrr, no murmur Pulm: lctab.   Msk: strength equal b/l.  Normal gait.   Psych: pleasant affect.   Skin: warm and dry     Assessment & Plan:   Encounter to establish care  Primary hypertension Assessment & Plan: Patient agreeable to blood pressure medication.  Has not taken blood pressure medication in the past.  Will start with amlodipine 5 mg.  Recheck in 1 month.   Postoperative hypothyroidism -     TSH; Future  BMI 31.0-31.9,adult -     Hemoglobin A1c; Future  Avitaminosis D -     VITAMIN D 25 Hydroxy (Vit-D Deficiency, Fractures); Future  Hypertriglyceridemia Assessment & Plan: Recheck lipid panel.  Continue Tricor.  Orders: -     Lipid panel; Future -     Comprehensive metabolic panel; Future  Menopausal symptoms Assessment & Plan: Patient complains of hot flashes and night sweats.  Also with some irritability.  Has tried oral estrogen replacement therapy in the past without improvement.  States she tried a different medication at 1 point but did not take it due to conflicts with her schedule.  Patient agreeable to SSRI.  Also discussed gabapentin.  Starting citalopram 10  mg, follow-up in 4 weeks.   Other orders -     Citalopram Hydrobromide; Take 1 tablet (10 mg total) by mouth daily.  Dispense: 30 tablet; Refill: 3 -     amLODIPine Besylate; Take 1 tablet (5 mg total) by mouth daily.  Dispense: 90 tablet; Refill: 3    Return in about 4 weeks (around 05/08/2023) for menopause, weight.   Sandre Kitty, MD

## 2023-04-10 NOTE — Assessment & Plan Note (Signed)
Patient complains of hot flashes and night sweats.  Also with some irritability.  Has tried oral estrogen replacement therapy in the past without improvement.  States she tried a different medication at 1 point but did not take it due to conflicts with her schedule.  Patient agreeable to SSRI.  Also discussed gabapentin.  Starting citalopram 10 mg, follow-up in 4 weeks.

## 2023-04-15 ENCOUNTER — Other Ambulatory Visit: Payer: Self-pay | Admitting: Family Medicine

## 2023-04-28 ENCOUNTER — Other Ambulatory Visit: Payer: BC Managed Care – PPO

## 2023-04-28 DIAGNOSIS — E781 Pure hyperglyceridemia: Secondary | ICD-10-CM

## 2023-04-28 DIAGNOSIS — E559 Vitamin D deficiency, unspecified: Secondary | ICD-10-CM

## 2023-04-28 DIAGNOSIS — Z6831 Body mass index (BMI) 31.0-31.9, adult: Secondary | ICD-10-CM

## 2023-04-28 DIAGNOSIS — E890 Postprocedural hypothyroidism: Secondary | ICD-10-CM

## 2023-04-29 LAB — HEMOGLOBIN A1C
Est. average glucose Bld gHb Est-mCnc: 105 mg/dL
Hgb A1c MFr Bld: 5.3 % (ref 4.8–5.6)

## 2023-04-29 LAB — COMPREHENSIVE METABOLIC PANEL
ALT: 26 [IU]/L (ref 0–32)
AST: 26 [IU]/L (ref 0–40)
Albumin: 4.6 g/dL (ref 3.9–4.9)
Alkaline Phosphatase: 66 [IU]/L (ref 44–121)
BUN/Creatinine Ratio: 18 (ref 9–23)
BUN: 13 mg/dL (ref 6–24)
Bilirubin Total: 0.3 mg/dL (ref 0.0–1.2)
CO2: 24 mmol/L (ref 20–29)
Calcium: 9.3 mg/dL (ref 8.7–10.2)
Chloride: 101 mmol/L (ref 96–106)
Creatinine, Ser: 0.74 mg/dL (ref 0.57–1.00)
Globulin, Total: 2.4 g/dL (ref 1.5–4.5)
Glucose: 94 mg/dL (ref 70–99)
Potassium: 4.4 mmol/L (ref 3.5–5.2)
Sodium: 139 mmol/L (ref 134–144)
Total Protein: 7 g/dL (ref 6.0–8.5)
eGFR: 100 mL/min/{1.73_m2} (ref >59)

## 2023-04-29 LAB — LIPID PANEL
Chol/HDL Ratio: 5.3 {ratio} — ABNORMAL HIGH (ref 0.0–4.4)
Cholesterol, Total: 252 mg/dL — ABNORMAL HIGH (ref 100–199)
HDL: 48 mg/dL (ref >39)
LDL Chol Calc (NIH): 153 mg/dL — ABNORMAL HIGH (ref 0–99)
Triglycerides: 275 mg/dL — ABNORMAL HIGH (ref 0–149)
VLDL Cholesterol Cal: 51 mg/dL — ABNORMAL HIGH (ref 5–40)

## 2023-04-29 LAB — TSH: TSH: 2.05 u[IU]/mL (ref 0.450–4.500)

## 2023-04-29 LAB — VITAMIN D 25 HYDROXY (VIT D DEFICIENCY, FRACTURES): Vit D, 25-Hydroxy: 33.4 ng/mL (ref 30.0–100.0)

## 2023-05-02 ENCOUNTER — Other Ambulatory Visit: Payer: Self-pay | Admitting: Family Medicine

## 2023-05-08 ENCOUNTER — Ambulatory Visit (INDEPENDENT_AMBULATORY_CARE_PROVIDER_SITE_OTHER): Payer: BC Managed Care – PPO | Admitting: Family Medicine

## 2023-05-08 ENCOUNTER — Encounter: Payer: Self-pay | Admitting: Family Medicine

## 2023-05-08 VITALS — BP 116/84 | HR 86 | Ht 63.0 in | Wt 181.0 lb

## 2023-05-08 DIAGNOSIS — E781 Pure hyperglyceridemia: Secondary | ICD-10-CM

## 2023-05-08 DIAGNOSIS — N951 Menopausal and female climacteric states: Secondary | ICD-10-CM | POA: Diagnosis not present

## 2023-05-08 DIAGNOSIS — I10 Essential (primary) hypertension: Secondary | ICD-10-CM

## 2023-05-08 DIAGNOSIS — Z6831 Body mass index (BMI) 31.0-31.9, adult: Secondary | ICD-10-CM | POA: Diagnosis not present

## 2023-05-08 MED ORDER — CITALOPRAM HYDROBROMIDE 20 MG PO TABS: 20.0000 mg | ORAL_TABLET | Freq: Every day | ORAL | 1 refills | Status: DC

## 2023-05-08 MED ORDER — PHENTERMINE HCL 15 MG PO CAPS: 15.0000 mg | ORAL_CAPSULE | ORAL | 1 refills | Status: DC

## 2023-05-08 NOTE — Assessment & Plan Note (Signed)
Improved.  Has been taking the Tricor for about 4 weeks consistently prior to last recheck.  Will recheck it again at next visit.  If needed and discuss changes to medication

## 2023-05-08 NOTE — Assessment & Plan Note (Signed)
Patient has 2 jobs and has difficulty eating healthy food because of this.  Also admits to eating out because she does not have time to cook.  Discussed calorie counting.  Discussed medication options. - Use calorie counting app to document intake for 1 week.  Advised her to subtract 500 cal from her typical daily intake to start - Prescribed phentermine.  Follow-up in 1 month.

## 2023-05-08 NOTE — Assessment & Plan Note (Signed)
Patient is taking her amlodipine.  No side effects.  Blood pressure much improved today.  Continue current treatment.

## 2023-05-08 NOTE — Progress Notes (Signed)
Established Patient Office Visit  Subjective   Patient ID: [name], female    DOB: [birth year]  Age: 47 y.o. MRN: [id number]  Chief Complaint  Patient presents with   Medical Management of Chronic Issues    HPI  Menopause-patient is taking her citalopram.  Has noticed that she is not having to wake up at night due to feeling hot or sweating.  Noticed some improvement in her hot flashes during the day but not completely resolved.  We discussed gabapentin or increasing the citalopram dose.  Patient agreeable to increase citalopram dose.  No side effects from her current dose.  Hypertension-patient taking her amlodipine 5 mg.  No side effects.  Tolerates it well.  Taking daily.  Hyperlipidemia-we discussed the patient's cholesterol levels.  She has been taking her Tricor for maybe 4 weeks prior to the last labs.  Discussed checking it again at her next visit and if needed adding another medication.  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  Advised to get her step send daily and gets about 9000-10,000 steps a day.  States her main issue is that she sometimes eats out because of her work schedule.  Says she eats at [employer] or gets a slice of pizza or quesadilla.  Tries to pack her lunch for her other job.  Does not count calories but tries to limit her portion size.  We discussed medication options.  Discussed calorie counting.  Patient agreeable to starting phentermine.   The 10-year ASCVD risk score (Arnett DK, et al., 2019) is: 1.8%  Health Maintenance Due  Topic Date Due   COVID-19 Vaccine (1 - 2024-25 season) Never done      Objective:     BP 116/84   Pulse 86   Ht 5\' 3"  (1.6 m)   Wt 181 lb (82.1 kg)   LMP 01/23/2015   SpO2 96%   BMI 32.06 kg/m    Physical Exam General: Alert, oriented Pulmonary: No respiratory distress Psych: Pleasant affect   No results found for  any visits on 05/08/23.      Assessment & Plan:   Hypertriglyceridemia Assessment & Plan: Improved.  Has been taking the Tricor for about 4 weeks consistently prior to last recheck.  Will recheck it again at next visit.  If needed and discuss changes to medication   Primary hypertension Assessment & Plan: Patient is taking her amlodipine.  No side effects.  Blood pressure much improved today.  Continue current treatment.   Menopausal symptoms Assessment & Plan: Patient had improvement in symptoms with citalopram 10 mg.  Is no longer waking up at night due to her vasomotor symptoms.  Daytime symptoms are improved but not resolved.  Patient agreeable to increasing the dose.  Discussed gabapentin as an option if increase in dose does not help.   BMI 31.0-31.9,adult Assessment & Plan: Patient has 2 jobs and has difficulty eating healthy food because of this.  Also admits to eating out because she does not have time to cook.  Discussed calorie counting.  Discussed medication options. - Use calorie counting app to document intake for 1 week.  Advised her to subtract 500 cal from her typical daily intake to start - Prescribed phentermine.  Follow-up in 1 month.   Other orders -     Phentermine HCl; Take 1 capsule (15 mg total) by mouth every morning.  Dispense: 30 capsule;  Refill: 1 -     Citalopram Hydrobromide; Take 1 tablet (20 mg total) by mouth daily.  Dispense: 30 tablet; Refill: 1     Return in about 4 weeks (around 06/05/2023) for Weight loss, cholesterol, menopausal symptoms yes.    Sandre Kitty, MD

## 2023-05-08 NOTE — Assessment & Plan Note (Signed)
Patient had improvement in symptoms with citalopram 10 mg.  Is no longer waking up at night due to her vasomotor symptoms.  Daytime symptoms are improved but not resolved.  Patient agreeable to increasing the dose.  Discussed gabapentin as an option if increase in dose does not help.

## 2023-05-08 NOTE — Patient Instructions (Signed)
It was nice to see you today,  We addressed the following topics today: -I have increased your escitalopram to 20 mg.  When I see you again in a month we will decide if we want to make any further changes - I have prescribed phentermine 15 mg to take once a day.  This is to help with his weight.  If willing to go up on the dose we will make the next time I see you - In addition to the phentermine you should use a calorie counting app to keep track of your daily calorie intake. - For 1 week I would like you to document your typical caloric intake as accurately as you can.  Then I would like used to to subtract 500 cal from that and use this as your new goal for total daily calories. - For example if you are typically eating 2200 cal a day, I would like you to reduce that to 1700 cal/day - A caloric deficit of about 500 cal a day should result in about a pound a week of weight loss.  Have a great day,  Frederic Jericho, MD

## 2023-05-09 DIAGNOSIS — S83282A Other tear of lateral meniscus, current injury, left knee, initial encounter: Secondary | ICD-10-CM | POA: Diagnosis not present

## 2023-05-31 ENCOUNTER — Other Ambulatory Visit: Payer: Self-pay | Admitting: Family Medicine

## 2023-06-09 ENCOUNTER — Ambulatory Visit: Payer: BC Managed Care – PPO | Admitting: Family Medicine

## 2023-07-07 ENCOUNTER — Ambulatory Visit: Payer: BC Managed Care – PPO | Admitting: Family Medicine

## 2023-07-07 ENCOUNTER — Encounter: Payer: Self-pay | Admitting: Family Medicine

## 2023-07-07 VITALS — BP 120/78 | HR 74 | Ht 63.0 in | Wt 177.4 lb

## 2023-07-07 DIAGNOSIS — E781 Pure hyperglyceridemia: Secondary | ICD-10-CM | POA: Diagnosis not present

## 2023-07-07 DIAGNOSIS — Z6831 Body mass index (BMI) 31.0-31.9, adult: Secondary | ICD-10-CM

## 2023-07-07 DIAGNOSIS — N951 Menopausal and female climacteric states: Secondary | ICD-10-CM

## 2023-07-07 MED ORDER — AMLODIPINE BESYLATE 5 MG PO TABS: 5.0000 mg | ORAL_TABLET | Freq: Every day | ORAL | 1 refills | Status: AC

## 2023-07-07 MED ORDER — FENOFIBRATE 145 MG PO TABS: 145.0000 mg | ORAL_TABLET | Freq: Every day | ORAL | 1 refills | Status: DC

## 2023-07-07 MED ORDER — PHENTERMINE HCL 37.5 MG PO CAPS: 37.5000 mg | ORAL_CAPSULE | ORAL | 1 refills | Status: AC

## 2023-07-07 MED ORDER — LEVOTHYROXINE SODIUM 112 MCG PO TABS: 112.0000 ug | ORAL_TABLET | Freq: Every day | ORAL | 1 refills | Status: DC

## 2023-07-07 MED ORDER — ESTRADIOL 1 MG PO TABS: 1.0000 mg | ORAL_TABLET | Freq: Every day | ORAL | 1 refills | Status: DC

## 2023-07-07 MED ORDER — CITALOPRAM HYDROBROMIDE 20 MG PO TABS: 20.0000 mg | ORAL_TABLET | Freq: Every day | ORAL | 1 refills | Status: DC

## 2023-07-07 NOTE — Assessment & Plan Note (Signed)
 No improvement in symptoms with increased from 10 to 20 mg citalopram.  Will keep her citalopram since it has helped somewhat with her symptoms including irritability.  Adding estradiol 1 mg daily.  Progesterone not indicated due to hysterectomy.  Follow-up 1 month.

## 2023-07-07 NOTE — Progress Notes (Signed)
   Established Patient Office Visit  Subjective   Patient ID: [name], female    DOB: 05/12/1975  Age: 48 y.o. MRN: [id number]  Chief Complaint  Patient presents with   Medical Management of Chronic Issues    HPI  Menopause-patient has not noticed any difference when increasing the citalopram from 10 to 20 mg.  Still having hot flashes and night sweats.  We discussed other alternatives including hormonal therapy.  Patient has had a hysterectomy, has 1 ovary, no family history of breast or endometrial or ovarian cancer.    Weight-patient started on phentermine 15 mg last time and.  Has not noticed any change in her weight appetite or other effects of the medication.  She has been trying to adjust her diet by eating healthier, lower calorie options.  Still eats out occasionally when she does not have the time to cook but gets healthier options such as grilled chicken and salad.  Triglycerides-patient is compliant with her Tricor.  Had some sugar-free creamer in her coffee this morning but otherwise has not had anything to eat or drink.     The 10-year ASCVD risk score (Arnett DK, et al., 2019) is: 2%  Health Maintenance Due  Topic Date Due   COVID-19 Vaccine (1 - 2024-25 season) Never done      Objective:     BP 120/78   Pulse 74   Ht 5\' 3"  (1.6 m)   Wt 177 lb 6.4 oz (80.5 kg)   LMP 01/23/2015   SpO2 99%   BMI 31.42 kg/m    Physical Exam General: Alert, oriented Pulmonary: No respiratory distress Psych: Pleasant affect   No results found for any visits on 07/07/23.      Assessment & Plan:   Hypertriglyceridemia Assessment & Plan: Compliant with Tricor.  Will recheck today.  Orders: -     Lipid panel  Menopausal symptoms Assessment & Plan: No improvement in symptoms with increased from 10 to 20 mg citalopram.  Will keep her citalopram since it has helped somewhat with her symptoms including irritability.  Adding estradiol 1 mg daily.  Progesterone  not indicated due to hysterectomy.  Follow-up 1 month.   BMI 31.0-31.9,adult Assessment & Plan: Patient did not notice any effects of 15 mg phentermine.  Will increase phentermine to 37.5.  Continue to encourage healthy diet, and exercise   Other orders -     Estradiol; Take 1 tablet (1 mg total) by mouth daily.  Dispense: 90 tablet; Refill: 1 -     amLODIPine Besylate; Take 1 tablet (5 mg total) by mouth daily.  Dispense: 90 tablet; Refill: 1 -     Citalopram Hydrobromide; Take 1 tablet (20 mg total) by mouth daily.  Dispense: 90 tablet; Refill: 1 -     Fenofibrate; Take 1 tablet (145 mg total) by mouth daily. Repeat a fasting lipid panel following completion.  Dispense: 90 tablet; Refill: 1 -     Levothyroxine Sodium; Take 1 tablet (112 mcg total) by mouth daily.  Dispense: 90 tablet; Refill: 1 -     Phentermine HCl; Take 1 capsule (37.5 mg total) by mouth every morning.  Dispense: 30 capsule; Refill: 1     Return in about 4 weeks (around 08/04/2023) for menopause, weight.    Sandre Kitty, MD

## 2023-07-07 NOTE — Assessment & Plan Note (Signed)
 Patient did not notice any effects of 15 mg phentermine.  Will increase phentermine to 37.5.  Continue to encourage healthy diet, and exercise

## 2023-07-07 NOTE — Assessment & Plan Note (Signed)
 Compliant with Tricor.  Will recheck today.

## 2023-07-07 NOTE — Patient Instructions (Signed)
 It was nice to see you today,  We addressed the following topics today: -I have sent in all your prescriptions to [facility] in [place]. - I have sent in an increased dose of the phentermine - I have sent in a new prescription for estradiol tablet that you will take once a day.  This is for menopausal symptoms. - I will follow-up with you in 1 month with a virtual visit  Have a great day,  Frederic Jericho, MD

## 2023-07-08 LAB — LIPID PANEL
Chol/HDL Ratio: 4.8 ratio — ABNORMAL HIGH (ref 0.0–4.4)
Cholesterol, Total: 240 mg/dL — ABNORMAL HIGH (ref 100–199)
HDL: 50 mg/dL (ref >39)
LDL Chol Calc (NIH): 151 mg/dL — ABNORMAL HIGH (ref 0–99)
Triglycerides: 212 mg/dL — ABNORMAL HIGH (ref 0–149)
VLDL Cholesterol Cal: 39 mg/dL (ref 5–40)

## 2023-07-10 ENCOUNTER — Encounter: Payer: Self-pay | Admitting: Family Medicine

## 2023-07-10 ENCOUNTER — Other Ambulatory Visit: Payer: Self-pay | Admitting: Family Medicine

## 2023-07-10 MED ORDER — ICOSAPENT ETHYL 1 G PO CAPS: 2.0000 g | ORAL_CAPSULE | Freq: Two times a day (BID) | ORAL | 2 refills | Status: DC

## 2023-09-20 ENCOUNTER — Encounter: Payer: Self-pay | Admitting: Family Medicine

## 2023-10-14 ENCOUNTER — Other Ambulatory Visit: Payer: Self-pay | Admitting: Family Medicine

## 2023-12-10 ENCOUNTER — Encounter: Payer: Self-pay | Admitting: Family Medicine

## 2024-01-01 DIAGNOSIS — M654 Radial styloid tenosynovitis [de Quervain]: Secondary | ICD-10-CM | POA: Diagnosis not present

## 2024-01-07 ENCOUNTER — Other Ambulatory Visit: Payer: Self-pay | Admitting: Family Medicine

## 2024-01-29 ENCOUNTER — Ambulatory Visit: Admitting: Family Medicine

## 2024-01-29 ENCOUNTER — Encounter: Payer: Self-pay | Admitting: Family Medicine

## 2024-01-29 VITALS — BP 127/82 | HR 84 | Ht 63.0 in | Wt 184.4 lb

## 2024-01-29 DIAGNOSIS — R1319 Other dysphagia: Secondary | ICD-10-CM | POA: Insufficient documentation

## 2024-01-29 DIAGNOSIS — R221 Localized swelling, mass and lump, neck: Secondary | ICD-10-CM

## 2024-01-29 NOTE — Progress Notes (Unsigned)
   Acute Office Visit  Subjective:     Patient ID: [name], female    DOB: 25-Dec-1975, 48 y.o.   MRN: [id number]  Chief Complaint  Patient presents with   Oral Swelling    HPI Patient is in today for   Subjective - Reports new onset dysphagia for the past 1 month. Reports the issue is progressively worsening. Episode yesterday involved hemoptysis. - Describes a sensation of throat closing or food expanding in the throat. Primarily occurs with solid foods, particularly rice, but has also occurred with a french fry. Noted with every meal. - Denies issues swallowing liquids, though occasionally chokes on water if drinking too quickly. - Denies swelling of the lips. No known food allergies.  Medications No current medications mentioned.  PMH, PSH, FH, Social Hx PMHx: History of hyperthyroidism, chronic colitis. PSHx: Thyroidectomy in 2015 or 2016 for hyperthyroidism with cancerous cells found, treated with radiation. Colonoscopy in 2024. Prior endoscopy several years ago. FH: Paternal grandmother had esophageal stretching. Social Hx: Does not consistently eat three meals a day.  ROS Constitutional: Denies fever, chills. HEENT: Reports dysphagia, sensation of throat swelling. Denies lip swelling. Respiratory: Denies difficulty breathing, except with choking episodes. GI: Reports dysphagia. History of chronic colitis.   ROS      Objective:    BP 127/82   Pulse 84   Ht 5' 3 (1.6 m)   Wt 184 lb 6.4 oz (83.6 kg)   LMP 01/23/2015   SpO2 97%   BMI 32.66 kg/m  {Vitals History (Optional):23777}  Physical Exam General: Alert, oriented HEENT: Moist oral mucosa.  No swelling or asymmetry. Neck: No lymphadenopathy, no masses.  Normal swallowing. CV: Regular rhythm  No results found for any visits on 01/29/24.      Assessment & Plan:   Esophageal dysphagia Assessment & Plan: - New onset dysphagia for 1 month, progressive, now associated with hemoptysis.  Sensation of throat closing, particularly with solids like rice. Past medical history of thyroid  cancer s/p thyroidectomy and radiation, and chronic colitis. Family history of esophageal stretching. Differential includes esophageal stricture, esophageal web, esophageal spasm, or food allergy (e.g., eosinophilic esophagitis). - Prescribed EpiPen  for emergency use. Counseled to go to the emergency department after use, even if symptoms improve, for any signs of anaphylaxis such as difficulty breathing or tongue swelling. - Ordered food allergy panel and alpha-gal blood tests. - Referred to Gastroenterology for likely EGD to evaluate for structural abnormalities. Will defer GI follow-up if allergy testing is positive and will refer to an allergist instead. - Follow up in the morning for lab work.  Orders: -     Alpha-Gal Panel -     Allergy Profile, Food -     Ambulatory referral to Gastroenterology -     CBC with Differential/Platelet; Future -     Comprehensive metabolic panel with GFR; Future  Throat swelling -     Alpha-Gal Panel -     Allergy Profile, Food -     CBC with Differential/Platelet; Future     Return in about 2 months (around 03/30/2024) for dysphagia.  Toribio MARLA Slain, MD

## 2024-01-29 NOTE — Assessment & Plan Note (Signed)
-   New onset dysphagia for 1 month, progressive, now associated with hemoptysis. Sensation of throat closing, particularly with solids like rice. Past medical history of thyroid  cancer s/p thyroidectomy and radiation, and chronic colitis. Family history of esophageal stretching. Differential includes esophageal stricture, esophageal web, esophageal spasm, or food allergy (e.g., eosinophilic esophagitis). - Prescribed EpiPen  for emergency use. Counseled to go to the emergency department after use, even if symptoms improve, for any signs of anaphylaxis such as difficulty breathing or tongue swelling. - Ordered food allergy panel and alpha-gal blood tests. - Referred to Gastroenterology for possible EGD to evaluate for structural abnormalities. Will defer GI follow-up if allergy testing is positive and will refer to an allergist instead. - Follow up in the morning for lab work.

## 2024-01-29 NOTE — Patient Instructions (Signed)
 It was nice to see you today,  We addressed the following topics today: -I am sending in a referral to the gastroenterologist. - I am also ordering a lab test to look for potential allergies to food.  You will need to schedule this lab visit upfront.  Have a great day,  Rolan Slain, MD

## 2024-01-31 ENCOUNTER — Other Ambulatory Visit

## 2024-01-31 DIAGNOSIS — R221 Localized swelling, mass and lump, neck: Secondary | ICD-10-CM

## 2024-01-31 DIAGNOSIS — R1319 Other dysphagia: Secondary | ICD-10-CM

## 2024-01-31 MED ORDER — EPINEPHRINE 0.3 MG/0.3ML IJ SOAJ: 0.3000 mg | INTRAMUSCULAR | 1 refills | Status: AC | PRN

## 2024-02-01 ENCOUNTER — Ambulatory Visit: Payer: Self-pay | Admitting: Family Medicine

## 2024-02-01 LAB — CBC WITH DIFFERENTIAL/PLATELET
Basophils Absolute: 0.1 x10E3/uL (ref 0.0–0.2)
Basos: 1 %
EOS (ABSOLUTE): 0.4 x10E3/uL (ref 0.0–0.4)
Eos: 8 %
Hematocrit: 41.8 % (ref 34.0–46.6)
Hemoglobin: 14.8 g/dL (ref 11.1–15.9)
Immature Grans (Abs): 0 x10E3/uL (ref 0.0–0.1)
Immature Granulocytes: 0 %
Lymphocytes Absolute: 1.3 x10E3/uL (ref 0.7–3.1)
Lymphs: 25 %
MCH: 33.7 pg — ABNORMAL HIGH (ref 26.6–33.0)
MCHC: 35.4 g/dL (ref 31.5–35.7)
MCV: 95 fL (ref 79–97)
Monocytes Absolute: 0.2 x10E3/uL (ref 0.1–0.9)
Monocytes: 3 %
Neutrophils Absolute: 3.3 x10E3/uL (ref 1.4–7.0)
Neutrophils: 62 %
Platelets: 263 x10E3/uL (ref 150–450)
RBC: 4.39 x10E6/uL (ref 3.77–5.28)
RDW: 12.6 % (ref 11.7–15.4)
WBC: 5.2 x10E3/uL (ref 3.4–10.8)

## 2024-02-01 LAB — COMPREHENSIVE METABOLIC PANEL WITH GFR
ALT: 38 IU/L — ABNORMAL HIGH (ref 0–32)
AST: 46 IU/L — ABNORMAL HIGH (ref 0–40)
Albumin: 4.5 g/dL (ref 3.9–4.9)
Alkaline Phosphatase: 77 IU/L (ref 41–116)
BUN/Creatinine Ratio: 7 — ABNORMAL LOW (ref 9–23)
BUN: 5 mg/dL — ABNORMAL LOW (ref 6–24)
Bilirubin Total: 0.3 mg/dL (ref 0.0–1.2)
CO2: 20 mmol/L (ref 20–29)
Calcium: 9.3 mg/dL (ref 8.7–10.2)
Chloride: 102 mmol/L (ref 96–106)
Creatinine, Ser: 0.75 mg/dL (ref 0.57–1.00)
Globulin, Total: 2.6 g/dL (ref 1.5–4.5)
Glucose: 155 mg/dL — ABNORMAL HIGH (ref 70–99)
Potassium: 3.9 mmol/L (ref 3.5–5.2)
Sodium: 138 mmol/L (ref 134–144)
Total Protein: 7.1 g/dL (ref 6.0–8.5)
eGFR: 98 mL/min/1.73 (ref >59)

## 2024-02-02 NOTE — Progress Notes (Signed)
 Called [facility] spoke to the tech she said she will try and add on those test if not she will fax something over

## 2024-02-05 LAB — ALLERGY PROFILE, FOOD
Allergen Salmon IgE: <0.1 kU/L
Codfish IgE: <0.1 kU/L
F001-IgE Egg White: <0.1 kU/L
F002-IgE Milk: <0.1 kU/L
F004-IgE Wheat: <0.1 kU/L
F010-IgE Sesame Seed: <0.1 kU/L
F017-IgE Hazelnut (Filbert): <0.1 kU/L
F018-IgE Brazil Nut: <0.1 kU/L
F020-IgE Almond: <0.1 kU/L
F202-IgE Cashew Nut: <0.1 kU/L
F256-IgE Walnut: <0.1 kU/L
F416-IgE Tri a 19(w-5 gliadin): <0.1 kU/L
Peanut, IgE: <0.1 kU/L
Scallop IgE: <0.1 kU/L
Shrimp IgE: <0.1 kU/L
Soybean IgE: <0.1 kU/L
Tuna: <0.1 kU/L

## 2024-02-05 LAB — SPECIMEN STATUS REPORT

## 2024-02-05 LAB — ALPHA-GAL PANEL
Allergen Lamb IgE: <0.1 kU/L
Beef IgE: <0.1 kU/L
IgE (Immunoglobulin E), Serum: 33 [IU]/mL (ref 6–495)
O215-IgE Alpha-Gal: <0.1 kU/L
Pork IgE: <0.1 kU/L

## 2024-03-13 ENCOUNTER — Other Ambulatory Visit: Payer: Self-pay | Admitting: Family Medicine

## 2024-03-27 ENCOUNTER — Other Ambulatory Visit: Payer: Self-pay | Admitting: Family Medicine

## 2024-03-27 ENCOUNTER — Encounter: Payer: Self-pay | Admitting: Physician Assistant

## 2024-04-01 ENCOUNTER — Ambulatory Visit: Admitting: Family Medicine

## 2024-04-01 ENCOUNTER — Encounter: Payer: Self-pay | Admitting: Family Medicine

## 2024-04-01 VITALS — BP 135/88 | HR 80 | Ht 63.0 in | Wt 186.0 lb

## 2024-04-01 DIAGNOSIS — Z6831 Body mass index (BMI) 31.0-31.9, adult: Secondary | ICD-10-CM | POA: Diagnosis not present

## 2024-04-01 DIAGNOSIS — R1319 Other dysphagia: Secondary | ICD-10-CM | POA: Diagnosis not present

## 2024-04-01 MED ORDER — BUPROPION HCL ER (XL) 150 MG PO TB24: 150.0000 mg | ORAL_TABLET | Freq: Every day | ORAL | 2 refills | Status: DC

## 2024-04-01 MED ORDER — NALTREXONE HCL 50 MG PO TABS: 25.0000 mg | ORAL_TABLET | Freq: Two times a day (BID) | ORAL | 2 refills | Status: AC

## 2024-04-01 NOTE — Patient Instructions (Signed)
 It was nice to see you today,  We addressed the following topics today: - I am sending in prescriptions for bupropion  and naltrexone  for weight management. - For the bupropion , take one 150 mg tablet daily. It can be taken in the morning or at night.  - For the naltrexone , I will prescribe the 50 mg tablet. Please split the tablet in half and take one half (25 mg) in the morning. You can work up to taking 1/2 tablet twice a day. - Potential side effects of bupropion  include nausea and palpitations. For naltrexone , the main side effect is nausea. If you have any stomach upset, you can reduce the dose or frequency. - If you have any issues or concerns with the new medications, please message us . - Please schedule your follow-up appointment at the front desk for two months from now.  Have a great day,  Rolan Slain, MD

## 2024-04-01 NOTE — Progress Notes (Signed)
   Established Patient Office Visit  Subjective   Patient ID: [name], female    DOB: 1975/06/15  Age: 48 y.o. MRN: [id number]  Chief Complaint  Patient presents with   Medical Management of Chronic Issues    HPI  Subjective - Reports difficulty swallowing solid foods. Describes a sensation of throat closing up making it difficult to breathe. Occurs with just one bite, particularly with Chinese or Japanese food containing rice. Suspects MSG or another ingredient like a spice. Reports staying away from rice and yeast. Denies need to use EpiPen . Denies coughing up blood. Reports relief with diphenhydramine  (Benadryl ). Taking small bites to prevent episodes. Has a GI appointment scheduled for January. - Reports weight continues to increase despite walking, appropriate diet, and not eating late at night. Loses a few pounds, but it returns.  Medications Currently taking phentermine  and citalopram .  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 History of recurrent head colds and flu-like symptoms with seasons. PSH: Hysterectomy, leading to menopause.  ROS HEENT: Positive for dysphagia to solids, sensation of throat swelling. Negative for hemoptysis. Constitutional: Positive for weight gain.    The 10-year ASCVD risk score (Arnett DK, et al., 2019) is: 2.3%  Health Maintenance Due  Topic Date Due   Hepatitis B Vaccines 19-59 Average Risk (1 of 3 - 19+ 3-dose series) Never done   DTaP/Tdap/Td (2 - Td or Tdap) 08/24/2023   COVID-19 Vaccine (1 - 2025-26 season) Never done      Objective:     BP 135/88   Pulse 80   Ht 5' 3 (1.6 m)   Wt 186 lb (84.4 kg)   LMP 01/23/2015   SpO2 98%   BMI 32.95 kg/m    Physical Exam Gen: alert, oriented Pulm: no respiratory distress Psych: pleasant affect   No results found for any visits on 04/01/24.      Assessment & Plan:   Esophageal dysphagia Assessment & Plan: Sensation  of throat closing with solid foods, particularly rice in Chinese/Japanese food. Symptoms improve with diphenhydramine . No hemoptysis. Food allergy  panel was negative. Has a GI appointment scheduled for January. - Continue to be careful with food intake, avoiding triggers and taking small bites. - Await gastroenterology evaluation. - If GI evaluation is unrevealing, will consider referral to an allergist for further testing options that do not involve skin pricks.   BMI 31.0-31.9,adult Assessment & Plan: Difficulty losing weight despite diet and exercise, which is attributed to menopause. Currently taking phentermine . Has tried Weight Watchers in the past with limited success. Discussed medication options. - Start bupropion  XL 150 mg daily. - Start naltrexone  50 mg. - Continue phentermine . - Follow up in 2 months to discuss progress and after GI appointment.   Other orders -     buPROPion  HCl ER (XL); Take 1 tablet (150 mg total) by mouth daily.  Dispense: 30 tablet; Refill: 2 -     Naltrexone  HCl; Take 0.5 tablets (25 mg total) by mouth in the morning and at bedtime.  Dispense: 30 tablet; Refill: 2     Return in about 2 months (around 06/01/2024) for weight, dysphagia.    Toribio MARLA Slain, MD

## 2024-04-08 NOTE — Assessment & Plan Note (Signed)
 Sensation of throat closing with solid foods, particularly rice in Chinese/Japanese food. Symptoms improve with diphenhydramine . No hemoptysis. Food allergy  panel was negative. Has a GI appointment scheduled for January. - Continue to be careful with food intake, avoiding triggers and taking small bites. - Await gastroenterology evaluation. - If GI evaluation is unrevealing, will consider referral to an allergist for further testing options that do not involve skin pricks.

## 2024-04-08 NOTE — Assessment & Plan Note (Signed)
 Difficulty losing weight despite diet and exercise, which is attributed to menopause. Currently taking phentermine . Has tried Weight Watchers in the past with limited success. Discussed medication options. - Start bupropion  XL 150 mg daily. - Start naltrexone  50 mg. - Continue phentermine . - Follow up in 2 months to discuss progress and after GI appointment.

## 2024-04-24 ENCOUNTER — Other Ambulatory Visit: Payer: Self-pay | Admitting: Family Medicine

## 2024-05-14 ENCOUNTER — Ambulatory Visit: Admitting: Physician Assistant

## 2024-05-14 ENCOUNTER — Other Ambulatory Visit (INDEPENDENT_AMBULATORY_CARE_PROVIDER_SITE_OTHER)

## 2024-05-14 ENCOUNTER — Encounter: Payer: Self-pay | Admitting: Physician Assistant

## 2024-05-14 VITALS — BP 120/76 | HR 99 | Ht 63.0 in | Wt 182.1 lb

## 2024-05-14 DIAGNOSIS — R197 Diarrhea, unspecified: Secondary | ICD-10-CM | POA: Diagnosis not present

## 2024-05-14 DIAGNOSIS — R1310 Dysphagia, unspecified: Secondary | ICD-10-CM

## 2024-05-14 DIAGNOSIS — R7989 Other specified abnormal findings of blood chemistry: Secondary | ICD-10-CM

## 2024-05-14 DIAGNOSIS — R195 Other fecal abnormalities: Secondary | ICD-10-CM

## 2024-05-14 LAB — CBC WITH DIFFERENTIAL/PLATELET
Basophils Absolute: 0 K/uL (ref 0.0–0.1)
Basophils Relative: 0.7 % (ref 0.0–3.0)
Eosinophils Absolute: 0.3 K/uL (ref 0.0–0.7)
Eosinophils Relative: 5.7 % — ABNORMAL HIGH (ref 0.0–5.0)
HCT: 41.1 % (ref 36.0–46.0)
Hemoglobin: 14.6 g/dL (ref 12.0–15.0)
Lymphocytes Relative: 20.4 % (ref 12.0–46.0)
Lymphs Abs: 1.1 K/uL (ref 0.7–4.0)
MCHC: 35.5 g/dL (ref 30.0–36.0)
MCV: 93 fl (ref 78.0–100.0)
Monocytes Absolute: 0.4 K/uL (ref 0.1–1.0)
Monocytes Relative: 7.3 % (ref 3.0–12.0)
Neutro Abs: 3.7 K/uL (ref 1.4–7.7)
Neutrophils Relative %: 65.9 % (ref 43.0–77.0)
Platelets: 237 K/uL (ref 150.0–400.0)
RBC: 4.42 Mil/uL (ref 3.87–5.11)
RDW: 12.8 % (ref 11.5–15.5)
WBC: 5.6 K/uL (ref 4.0–10.5)

## 2024-05-14 LAB — HEPATIC FUNCTION PANEL
ALT: 37 U/L — ABNORMAL HIGH (ref 3–35)
AST: 39 U/L — ABNORMAL HIGH (ref 5–37)
Albumin: 4.6 g/dL (ref 3.5–5.2)
Alkaline Phosphatase: 55 U/L (ref 39–117)
Bilirubin, Direct: 0.1 mg/dL (ref 0.1–0.3)
Total Bilirubin: 0.5 mg/dL (ref 0.2–1.2)
Total Protein: 7.3 g/dL (ref 6.0–8.3)

## 2024-05-14 LAB — BASIC METABOLIC PANEL WITH GFR
BUN: 7 mg/dL (ref 6–23)
CO2: 26 meq/L (ref 19–32)
Calcium: 9.2 mg/dL (ref 8.4–10.5)
Chloride: 105 meq/L (ref 96–112)
Creatinine, Ser: 0.63 mg/dL (ref 0.40–1.20)
GFR: 104.79 mL/min
Glucose, Bld: 119 mg/dL — ABNORMAL HIGH (ref 70–99)
Potassium: 3.7 meq/L (ref 3.5–5.1)
Sodium: 140 meq/L (ref 135–145)

## 2024-05-14 LAB — SEDIMENTATION RATE: Sed Rate: 3 mm/h (ref 0–20)

## 2024-05-14 LAB — C-REACTIVE PROTEIN: CRP: 0.8 mg/dL — ABNORMAL LOW (ref 1.0–20.0)

## 2024-05-14 MED ORDER — OMEPRAZOLE 20 MG PO CPDR: 20.0000 mg | DELAYED_RELEASE_CAPSULE | Freq: Every day | ORAL | 2 refills | Status: AC

## 2024-05-14 NOTE — Patient Instructions (Addendum)
 Your provider has requested that you go to the basement level for lab work before leaving today. Press B on the elevator. The lab is located at the first door on the left as you exit the elevator.  You will be contacted by [facility] in the next 2 days to arrange a ULTRASOUND ELASTOGRAPHY.  The number on your caller ID will be [phone], please answer when they call.  If you have not heard from them in 2 days please call [phone] to schedule.    HOLD Phentermine  7 days prior to your procedure   VISIT SUMMARY:  During your visit, we discussed your recent episodes of difficulty swallowing, elevated liver function tests, chronic loose stools, and ongoing urinary symptoms. We have scheduled tests and provided recommendations to address these issues.  YOUR PLAN:  DYSPHAGIA: You have experienced sudden difficulty swallowing with certain foods, causing food to get stuck in your throat. -We have scheduled an EGD (a type of endoscopy) to check for any structural issues, celiac disease, H. pylori infection, and narrowing of the esophagus. -Start taking low-dose omeprazole  to see if it helps with possible acid reflux. -Continue eating soft foods until the EGD is done. -Stop taking phentermine  10 days before the EGD. -Follow the written instructions provided for stopping medications before the procedure.  ELEVATED LIVER FUNCTION TESTS: Your liver enzyme levels have been higher than normal, possibly due to recent alcohol intake and medication use. -We have ordered a liver ultrasound with elastography to check for liver fat and fibrosis. -We will repeat liver function tests and do additional blood work. -Avoid alcohol completely until we have more information. -If the ultrasound or blood tests show any issues, we may need to do more tests for autoimmune liver disease.  CHRONIC LOOSE STOOLS: You have had watery stools and increased gas since childbirth. -We have ordered a  celiac panel to check for gluten sensitivity. -We have ordered an erythrocyte sedimentation rate test to check for inflammation. -Start taking Benefiber (one tablespoon daily) to help improve stool consistency.  PELVIC FLOOR DYSFUNCTION: You have symptoms that suggest issues with your pelvic floor muscles, likely related to childbirth. -We provided information on pelvic floor dysfunction. -Consider starting pelvic floor physical therapy.  SUSPECTED INTERSTITIAL CYSTITIS: You have ongoing bladder symptoms that may be due to interstitial cystitis, which can overlap with pelvic floor issues. -We discussed the likely diagnosis of interstitial cystitis based on your symptoms. -We provided information on interstitial cystitis and its connection to pelvic floor dysfunction.  Here some information about pelvic floor dysfunction. This may be contributing to some of your symptoms. We will continue with our evaluation but I do want you to consider adding on fiber supplement with low-dose MiraLAX daily. We could also refer to pelvic floor physical therapy.   Pelvic Floor Dysfunction, Female Pelvic floor dysfunction (PFD) is a condition that results when the group of muscles and connective tissues that support the organs in the pelvis (pelvic floor muscles) do not work well. These muscles and their connections form a sling that supports the colon and bladder. In women, they also support the uterus. PFD causes pelvic floor muscles to be too weak, too tight, or both. In PFD, muscle movements are not coordinated. This may cause bowel or bladder problems. It may also cause pain. What are the causes? This condition may be caused by an injury to the pelvic area or by a weakening of pelvic muscles. This often results from pregnancy and childbirth or  other types of strain. In many cases, the exact cause is not known. What increases the risk? The following factors may make you more likely to develop this  condition: Having chronic bladder tissue inflammation (interstitial cystitis). Being an older person. Being overweight. History of radiation treatment for cancer in the pelvic region. Previous pelvic surgery, such as removal of the uterus (hysterectomy). What are the signs or symptoms? Symptoms of this condition vary and may include: Bladder symptoms, such as: Trouble starting urination and emptying the bladder. Frequent urinary tract infections. Leaking urine when coughing, laughing, or exercising (stress incontinence). Having to pass urine urgently or frequently. Pain when passing urine. Bowel symptoms, such as: Constipation. Urgent or frequent bowel movements. Incomplete bowel movements. Painful bowel movements. Leaking stool or gas. Unexplained genital or rectal pain. Genital or rectal muscle spasms. Low back pain. Other symptoms may include: A heavy, full, or aching feeling in the vagina. A bulge that protrudes into the vagina. Pain during or after sex. How is this diagnosed? This condition may be diagnosed based on: Your symptoms and medical history. A physical exam. During the exam, your health care provider may check your pelvic muscles for tightness, spasm, pain, or weakness. This may include a rectal exam and a pelvic exam. In some cases, you may have diagnostic tests, such as: Electrical muscle function tests. Urine flow testing. X-ray tests of bowel function. Ultrasound of the pelvic organs. How is this treated? Treatment for this condition depends on the symptoms. Treatment options include: Physical therapy. This may include Kegel exercises to help relax or strengthen the pelvic floor muscles. Biofeedback. This type of therapy provides feedback on how tight your pelvic floor muscles are so that you can learn to control them. Internal or external massage therapy. A treatment that involves electrical stimulation of the pelvic floor muscles to help control pain  (transcutaneous electrical nerve stimulation, or TENS). Sound wave therapy (ultrasound) to reduce muscle spasms. Medicines, such as: Muscle relaxants. Bladder control medicines. Surgery to reconstruct or support pelvic floor muscles may be an option if other treatments do not help. Follow these instructions at home: Activity Do your usual activities as told by your health care provider. Ask your health care provider if you should modify any activities. Do pelvic floor strengthening or relaxing exercises at home as told by your physical therapist. Lifestyle Maintain a healthy weight. Eat foods that are high in fiber, such as beans, whole grains, and fresh fruits and vegetables. Limit foods that are high in fat and processed sugars, such as fried or sweet foods. Manage stress with relaxation techniques such as yoga or meditation. General instructions If you have problems with leakage: Use absorbable pads or wear padded underwear. Wash frequently with mild soap. Keep your genital and anal area as clean and dry as possible. Ask your health care provider if you should try a barrier cream to prevent skin irritation. Take warm baths to relieve pelvic muscle tension or spasms. Take over-the-counter and prescription medicines only as told by your health care provider. Keep all follow-up visits. How is this prevented? The cause of PFD is not always known, but there are a few things you can do to reduce the risk of developing this condition, including: Staying at a healthy weight. Getting regular exercise. Managing stress. Contact a health care provider if: Your symptoms are not improving with home care. You have signs or symptoms of PFD that get worse at home. You develop new signs or symptoms. You have signs  of a urinary tract infection, such as: Fever. Chills. Increased urinary frequency. A burning feeling when urinating. You have not had a bowel movement in 3 days  (constipation). Summary Pelvic floor dysfunction results when the muscles and connective tissues in your pelvic floor do not work well. These muscles and their connections form a sling that supports your colon and bladder. In women, they also support the uterus. PFD may be caused by an injury to the pelvic area or by a weakening of pelvic muscles. PFD causes pelvic floor muscles to be too weak, too tight, or a combination of both. Symptoms may vary from person to person. In most cases, PFD can be treated with physical therapies and medicines. Surgery may be an option if other treatments do not help. This information is not intended to replace advice given to you by your health care provider. Make sure you discuss any questions you have with your health care provider. Document Revised: 09/02/2020 Document Reviewed: 09/02/2020 Elsevier Patient Education  2022 [employer].  Due to recent changes in healthcare laws, you may see the results of your imaging and laboratory studies on MyChart before your provider has had a chance to review them.  We understand that in some cases there may be results that are confusing or concerning to you. Not all laboratory results come back in the same time frame and the provider may be waiting for multiple results in order to interpret others.  Please give us  48 hours in order for your provider to thoroughly review all the results before contacting the office for clarification of your results.    I appreciate the  opportunity to care for you  Thank You   [facility]

## 2024-05-14 NOTE — Progress Notes (Signed)
 "    05/14/2024 [name] [id number] [birth year]  Referring provider: Chandra Toribio POUR, MD Primary GI doctor: Dr. Federico  ASSESSMENT AND PLAN:  Dysphagia acute with 3 episodes of dysphagia with vomiting With chicken, rice, bread a, had to self induce vomiting History of smoking Q 6 years, ETOH on weekends, rare NSAIDS history of RAI and thyroidectomy, no neck surgery Has allergies, denies asthma, eczema Denies GERD, melena Last dose of phentermine  this AM, will need to hold 7 days prior PLAN: -Questionable LPR, add on prilosec 20 mg daily -Schedule EGD with dilatation to evaluate for stenosis, tumor, erosive/infectious esophagititis, and EOE. I discussed risks of EGD with patient today, including risk of sedation, bleeding or perforation.  Patient provides understanding and gave verbal consent to proceed. - consider barium swallow if negative -In the interim patient advised about swallowing precautions.  -Eat slowly, chew food well before swallowing.  -Drink liquids in between each bite to avoid food impaction. - ER precautions discussed with the patient  Elevated LFTs Patient had LFT elevation September 24 and again September 2025 AST greater than ALT normal bilirubin alk phos, no abdominal imaging    Latest Ref Rng & Units 01/31/2024   10:23 AM 04/28/2023    9:30 AM 01/11/2023    4:09 PM  Hepatic Function  Total Protein 6.0 - 8.5 g/dL 7.1  7.0  7.2   Albumin 3.9 - 4.9 g/dL 4.5  4.6  4.5   AST 0 - 40 IU/L 46  26  50   ALT 0 - 32 IU/L 38  26  43   Alk Phosphatase 41 - 116 IU/L 77  66  98   Total Bilirubin 0.0 - 1.2 mg/dL 0.3  0.3  0.2    Platelets 263  No family history of liver issues Does drink ETOH on weekends Possible medications: fenofibrate  added about a year ago Fib 4 calculated from liver function 01/31/2024 is 1.36 with further investigation needed - Repeat liver labs today - Get right upper quadrant abdominal ultrasound with elastography - If it shows fatty  liver and LFTs remain elevated get hepatocellular workup  Loose stools since birth of her daughter Has BM daily, no blood in stool Check celiac, add on fiber Consider pelvic floor PT   [facility] 08/24/2022 colonoscopy with Dr. Therisa excellent bowel prep granularity in the ascending colon recall 10 years.  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 female with a past medical history listed below presents for evaluation of dysphagia.   Previously seen by Dr. Therisa 2024 for screening colonoscopy  Discussed the use of AI scribe software for clinical note transcription with the patient, who gave verbal consent to proceed.  History of Present Illness   [name] is a 48 year old female with prior thyroidectomy who presents with new-onset intermittent dysphagia.  Three distinct episodes of acute dysphagia have occurred over the past several months, each marked by sudden food impaction in the upper throat. The first episode followed ingestion of rice, resulting in complete obstruction relieved only by induced vomiting. Subsequent episodes involved grilled chicken, potatoes, and bread, with similar sensations of food swelling in the throat and inability to swallow. Between episodes, swallowing is normal. No prior history of heartburn, reflux, or swallowing difficulties. She is able to breathe during episodes  but feels as if nothing can move. She now prefers liquids and soft foods due to fear of recurrence, especially in public settings. No history of neck surgery other than thyroidectomy and radioiodine ablation. Family history is notable for a paternal grandmother who required esophageal dilation.  Elevated liver function tests were first noted in September 2024 and again more recently. She attributes a  possible recent elevation to alcohol intake the night before blood work and typically drinks alcohol on weekends, with the most recent consumption on the Sunday prior to this visit. No family history of liver disease. No new medications in the past six months to a year, except for fenofibrate  (added about a year ago) and amlodipine . She also takes phentermine  and Zyrtec  daily. No use of NSAIDs such as ibuprofen or Aleve.  Chronic loose stools have persisted since a C-section and subsequent infection 17 years ago. Daily bowel movements are mostly watery with some texture, associated with frequent gas. No blood in the stool, though occasionally stools are darker, attributed to dietary factors. No abdominal pain, bloating, or constipation.  She quit smoking six years ago. Allergies are present and managed with daily Zyrtec . Occasional throat clearing and increased mucus occur, but not consistently. No history of asthma, eczema, shortness of breath, chest pain, or use of pain medications. Ongoing urinary symptoms have persisted since an infection following her C-section, and she has a history of being treated for cystitis.        She  reports that she has quit smoking. Her smoking use included cigarettes. She has a 10 pack-year smoking history. She has never used smokeless tobacco. She reports current alcohol use of about 6.0 standard drinks of alcohol per week. She reports that she does not use drugs.  RELEVANT GI HISTORY, IMAGING AND LABS: Results   Labs Liver function panel (01/2023): Elevated liver enzymes FIB-4: Value indicates further investigation needed for fatty liver and fibrosis risk  Diagnostic Colonoscopy (08/2022): Adequate bowel preparation, no polyps, ten-year surveillance interval recommended      CBC    Component Value Date/Time   WBC 5.2 01/31/2024 1023   WBC 16.4 (H) 01/27/2015 0411   RBC 4.39 01/31/2024 1023   RBC 3.64 (L) 01/27/2015 0411   HGB 14.8 01/31/2024 1023   HCT  41.8 01/31/2024 1023   PLT 263 01/31/2024 1023   MCV 95 01/31/2024 1023   MCH 33.7 (H) 01/31/2024 1023   MCH 31.6 01/27/2015 0411   MCHC 35.4 01/31/2024 1023   MCHC 34.6 01/27/2015 0411   RDW 12.6 01/31/2024 1023   LYMPHSABS 1.3 01/31/2024 1023   MONOABS 0.7 08/16/2013 0919   EOSABS 0.4 01/31/2024 1023   BASOSABS 0.1 01/31/2024 1023   Recent Labs    01/31/24 1023  HGB 14.8    CMP     Component Value Date/Time   NA 138 01/31/2024 1023   K 3.9 01/31/2024 1023   CL 102 01/31/2024 1023   CO2 20 01/31/2024 1023   GLUCOSE 155 (H) 01/31/2024 1023   GLUCOSE 91 08/16/2013 0919   BUN 5 (L) 01/31/2024 1023   CREATININE 0.75 01/31/2024 1023   CALCIUM 9.3 01/31/2024 1023   PROT 7.1 01/31/2024 1023   ALBUMIN 4.5 01/31/2024 1023   AST 46 (H) 01/31/2024 1023   ALT 38 (H) 01/31/2024 1023   ALKPHOS 77 01/31/2024 1023   BILITOT 0.3 01/31/2024 1023   GFRNONAA 99 12/20/2016 0806   GFRAA 114 12/20/2016 0806      Latest Ref Rng &  Units 01/31/2024   10:23 AM 04/28/2023    9:30 AM 01/11/2023    4:09 PM  Hepatic Function  Total Protein 6.0 - 8.5 g/dL 7.1  7.0  7.2   Albumin 3.9 - 4.9 g/dL 4.5  4.6  4.5   AST 0 - 40 IU/L 46  26  50   ALT 0 - 32 IU/L 38  26  43   Alk Phosphatase 41 - 116 IU/L 77  66  98   Total Bilirubin 0.0 - 1.2 mg/dL 0.3  0.3  0.2       Current Medications:   Current Outpatient Medications (Endocrine & Metabolic):    estradiol  (ESTRACE ) 1 MG tablet, TAKE 1 TABLET BY MOUTH EVERY DAY   levothyroxine  (SYNTHROID ) 112 MCG tablet, TAKE 1 TABLET BY MOUTH EVERY DAY  Current Outpatient Medications (Cardiovascular):    amLODipine  (NORVASC ) 5 MG tablet, Take 1 tablet (5 mg total) by mouth daily.   EPINEPHrine  (EPIPEN  2-PAK) 0.3 mg/0.3 mL IJ SOAJ injection, Inject 0.3 mg into the muscle as needed for anaphylaxis.   fenofibrate  (TRICOR ) 145 MG tablet, TAKE 1 TABLET (145 MG TOTAL) BY MOUTH DAILY. REPEAT A FASTING LIPID PANEL FOLLOWING COMPLETION.   icosapent  Ethyl (VASCEPA ) 1  g capsule, TAKE 2 CAPSULES BY MOUTH 2 TIMES DAILY.  Current Outpatient Medications (Analgesics):    meloxicam (MOBIC) 15 MG tablet, Take 15 mg by mouth daily as needed.  Current Outpatient Medications (Other):    buPROPion  (WELLBUTRIN  XL) 150 MG 24 hr tablet, TAKE 1 TABLET BY MOUTH EVERY DAY   citalopram  (CELEXA ) 20 MG tablet, Take 1 tablet (20 mg total) by mouth daily.   naltrexone  (DEPADE) 50 MG tablet, Take 0.5 tablets (25 mg total) by mouth in the morning and at bedtime.   omeprazole  (PRILOSEC) 20 MG capsule, Take 1 capsule (20 mg total) by mouth daily before breakfast.   phentermine  37.5 MG capsule, Take 1 capsule (37.5 mg total) by mouth every morning.  Medical History:  Past Medical History:  Diagnosis Date   Dysmenorrhea    History of goiter    multinodalar thyroid  goiter--  s/p  total thyroidectomy---  per path,  Fibrotic adenomatous nodule    History of hyperthyroidism    RAI tx March 2015   Hyperthyroidism    Hypothyroidism, postradioiodine therapy    Menorrhagia    Uterus, adenomyosis    Allergies: Allergies[1]   Surgical History:  She  has a past surgical history that includes Cesarean section (07/17/2006); Thyroidectomy (N/A, 08/19/2014); Laparoscopic tubal ligation (08/23/2006); Abdominal hysterectomy (2017); Tubal ligation; and Colonoscopy with propofol  (N/A, 08/24/2022). Family History:  Her family history includes Cancer in her maternal grandmother; Colon cancer in her paternal grandmother; Diabetes in her father; Heart disease in her father; Hypertension in her father; Ulcerative colitis in her paternal grandmother.  REVIEW OF SYSTEMS  : All other systems reviewed and negative except where noted in the History of Present Illness.  PHYSICAL EXAM: BP 120/76   Pulse 99   Ht 5' 3 (1.6 m)   Wt 182 lb 2 oz (82.6 kg)   LMP 01/23/2015   BMI 32.26 kg/m  Physical Exam   GENERAL APPEARANCE: Well nourished, in no apparent distress. HEENT: No cervical  lymphadenopathy, unremarkable thyroid , sclerae anicteric, conjunctiva pink. RESPIRATORY: Respiratory effort normal, breath sounds equal bilateral without rales, rhonchi, wheezing. CARDIO: Regular rate and rhythm with no murmurs, rubs, or gallops, peripheral pulses intact. ABDOMEN: Soft, non-distended, active bowel sounds in all four quadrants, tender to  palpation, no rebound, no mass appreciated. RECTAL: Declines. MUSCULOSKELETAL: Full range of motion, normal gait, without edema. SKIN: Dry, intact without rashes or lesions. No jaundice. NEURO: Alert, oriented, no focal deficits. PSYCH: Cooperative, normal mood and affect.      Alan JONELLE Coombs, PA-C 11:39 AM      [1]  Allergies Allergen Reactions   Allegra [Fexofenadine] Rash   "

## 2024-05-15 ENCOUNTER — Encounter: Payer: Self-pay | Admitting: Internal Medicine

## 2024-05-15 LAB — TISSUE TRANSGLUTAMINASE, IGA: (tTG) Ab, IgA: <1 U/mL

## 2024-05-15 LAB — IGA: Immunoglobulin A: 267 mg/dL (ref 47–310)

## 2024-05-16 ENCOUNTER — Ambulatory Visit: Payer: Self-pay | Admitting: Physician Assistant

## 2024-05-16 DIAGNOSIS — R7989 Other specified abnormal findings of blood chemistry: Secondary | ICD-10-CM

## 2024-05-16 DIAGNOSIS — Z1159 Encounter for screening for other viral diseases: Secondary | ICD-10-CM

## 2024-05-22 ENCOUNTER — Encounter: Payer: Self-pay | Admitting: Internal Medicine

## 2024-05-22 ENCOUNTER — Ambulatory Visit: Admitting: Internal Medicine

## 2024-05-22 VITALS — BP 115/83 | HR 62 | Temp 97.4°F | Resp 22 | Ht 63.0 in | Wt 182.0 lb

## 2024-05-22 DIAGNOSIS — R1310 Dysphagia, unspecified: Secondary | ICD-10-CM

## 2024-05-22 DIAGNOSIS — K222 Esophageal obstruction: Secondary | ICD-10-CM

## 2024-05-22 MED ORDER — SODIUM CHLORIDE 0.9 % IV SOLN: 500.0000 mL | Freq: Once | INTRAVENOUS | Status: DC

## 2024-05-22 NOTE — Progress Notes (Signed)
To pacu, vss. Report to [email or web address] 

## 2024-05-22 NOTE — Patient Instructions (Addendum)
-  Await pathology results -Return to GI office in 2-3 months (appointment scheduled)  YOU HAD AN ENDOSCOPIC PROCEDURE TODAY AT THE [facility]:   Refer to the procedure report that was given to you for any specific questions about what was found during the examination.  If the procedure report does not answer your questions, please call your gastroenterologist to clarify.  If you requested that your care partner not be given the details of your procedure findings, then the procedure report has been included in a sealed envelope for you to review at your convenience later.  YOU SHOULD EXPECT: Some feelings of bloating in the abdomen. Passage of more gas than usual.  Walking can help get rid of the air that was put into your GI tract during the procedure and reduce the bloating. If you had a lower endoscopy (such as a colonoscopy or flexible sigmoidoscopy) you may notice spotting of blood in your stool or on the toilet paper. If you underwent a bowel prep for your procedure, you may not have a normal bowel movement for a few days.  Please Note:  You might notice some irritation and congestion in your nose or some drainage.  This is from the oxygen used during your procedure.  There is no need for concern and it should clear up in a day or so.  SYMPTOMS TO REPORT IMMEDIATELY:  Following upper endoscopy (EGD)  Vomiting of blood or coffee ground material  New chest pain or pain under the shoulder blades  Painful or persistently difficult swallowing  New shortness of breath  Fever of 100F or higher  Black, tarry-looking stools  For urgent or emergent issues, a gastroenterologist can be reached at any hour by calling [phone]. Do not use MyChart messaging for urgent concerns.    DIET:  We do recommend a small meal at first, but then you may proceed to your regular diet.  Drink plenty of fluids but you should avoid alcoholic beverages for 24 hours.  ACTIVITY:  You should plan to  take it easy for the rest of today and you should NOT DRIVE or use heavy machinery until tomorrow (because of the sedation medicines used during the test).    FOLLOW UP: Our staff will call the number listed on your records the next business day following your procedure.  We will call around 7:15- 8:00 am to check on you and address any questions or concerns that you may have regarding the information given to you following your procedure. If we do not reach you, we will leave a message.     If any biopsies were taken you will be contacted by phone or by letter within the next 1-3 weeks.  Please call us  at [phone] if you have not heard about the biopsies in 3 weeks.    SIGNATURES/CONFIDENTIALITY: You and/or your care partner have signed paperwork which will be entered into your electronic medical record.  These signatures attest to the fact that that the information above on your After Visit Summary has been reviewed and is understood.  Full responsibility of the confidentiality of this discharge information lies with you and/or your care-partner.

## 2024-05-22 NOTE — Progress Notes (Signed)
 Called to room to assist during endoscopic procedure.  Patient ID and intended procedure confirmed with present staff. Received instructions for my participation in the procedure from the performing physician.

## 2024-05-22 NOTE — Op Note (Addendum)
 [facility] Patient Name: [name] Procedure Date: 05/22/2024 7:31 AM MRN: [id number] Endoscopist: Rosario Estefana Kidney , , [id number] Age: 49 Referring MD:  Date of Birth: December 11, 1975 Gender: Female Account #: [id number] Procedure:                Upper GI endoscopy Indications:              Dysphagia Medicines:                Monitored Anesthesia Care Procedure:                Pre-Anesthesia Assessment:                           - Prior to the procedure, a History and Physical                            was performed, and patient medications and                            allergies were reviewed. The patient's tolerance of                            previous anesthesia was also reviewed. The risks                            and benefits of the procedure and the sedation                            options and risks were discussed with the patient.                            All questions were answered, and informed consent                            was obtained. Prior Anticoagulants: The patient has                            taken no anticoagulant or antiplatelet agents. ASA                            Grade Assessment: II - A patient with mild systemic                            disease. After reviewing the risks and benefits,                            the patient was deemed in satisfactory condition to                            undergo the procedure.                           After obtaining informed consent, the endoscope was  passed under direct vision. Throughout the                            procedure, the patient's blood pressure, pulse, and                            oxygen saturations were monitored continuously. The                            Olympus Scope [id number] was introduced through the                            mouth, and advanced to the second part of duodenum.                            The upper GI endoscopy was  accomplished without                            difficulty. The patient tolerated the procedure                            well. Scope In: Scope Out: Findings:                 One benign-appearing, intrinsic moderate                            (circumferential scarring or stenosis; an endoscope                            may pass) stenosis was found at the                            gastroesophageal junction. This stenosis measured                            less than one cm (in length). The stenosis was                            traversed. A guidewire was placed and the scope was                            withdrawn. Dilation was performed with a Savary                            dilator with mild resistance at 19 mm. The dilation                            site was examined following endoscope reinsertion                            and showed mild mucosal disruption.                           Biopsies were taken with  a cold forceps in the                            proximal esophagus and in the distal esophagus for                            histology.                           The entire examined stomach was normal.                           The examined duodenum was normal. Complications:            No immediate complications. Estimated Blood Loss:     Estimated blood loss was minimal. Impression:               - Benign-appearing esophageal stenosis. Dilated.                           - Normal stomach.                           - Normal examined duodenum.                           - Biopsies were taken with a cold forceps for                            histology in the proximal esophagus and in the                            distal esophagus. Recommendation:           - Discharge patient to home (with escort).                           - Await pathology results.                           - Return to GI clinic in 2-3 months.                           - The findings and  recommendations were discussed                            with the patient. Dr Estefana Federico Rosario Estefana Federico,  05/22/2024 8:31:59 AM

## 2024-05-22 NOTE — Progress Notes (Signed)
 "   GASTROENTEROLOGY PROCEDURE H&P NOTE   Primary Care Physician: Chandra Toribio POUR, MD    Reason for Procedure:   Dysphagia  Plan:    EGD  Patient is appropriate for endoscopic procedure(s) in the ambulatory ([facility]) setting.  The nature of the procedure, as well as the risks, benefits, and alternatives were carefully and thoroughly reviewed with the patient. Ample time for discussion and questions allowed. The patient understood, was satisfied, and agreed to proceed.     HPI: [name] is a 49 y.o. female who presents for EGD for evaluation of dysphagia .  Patient was most recently seen in the Gastroenterology Clinic on 05/14/24.  No interval change in medical history since that appointment. Please refer to that note for full details regarding GI history and clinical presentation.   Past Medical History:  Diagnosis Date   Allergy     seasonal   Dysmenorrhea    GERD (gastroesophageal reflux disease)    History of goiter    multinodalar thyroid  goiter--  s/p  total thyroidectomy---  per path,  Fibrotic adenomatous nodule    History of hyperthyroidism    RAI tx March 2015   Hyperlipidemia    Hypertension    Hyperthyroidism    Hypothyroidism, postradioiodine therapy    Menorrhagia    Uterus, adenomyosis     Past Surgical History:  Procedure Laterality Date   ABDOMINAL HYSTERECTOMY  2017   CESAREAN SECTION  07/17/2006   COLONOSCOPY     COLONOSCOPY WITH PROPOFOL  N/A 08/24/2022   Procedure: COLONOSCOPY WITH PROPOFOL ;  Surgeon: Therisa Bi, MD;  Location: [facility] ENDOSCOPY;  Service: Gastroenterology;  Laterality: N/A;   LAPAROSCOPIC TUBAL LIGATION  08/23/2006   w/  fulgeration billaterlly   THYROIDECTOMY N/A 08/19/2014   Procedure: TOTAL THYROIDECTOMY;  Surgeon: Lynda Leos, MD;  Location: [facility] OR;  Service: General;  Laterality: N/A;   TUBAL LIGATION      Prior to Admission medications  Medication Sig Start Date End Date Taking? Authorizing Provider  amLODipine   (NORVASC ) 5 MG tablet Take 1 tablet (5 mg total) by mouth daily. 07/07/23  Yes Chandra Toribio POUR, MD  buPROPion  (WELLBUTRIN  XL) 150 MG 24 hr tablet TAKE 1 TABLET BY MOUTH EVERY DAY 04/24/24  Yes Chandra Toribio POUR, MD  citalopram  (CELEXA ) 20 MG tablet Take 1 tablet (20 mg total) by mouth daily. 07/07/23  Yes Chandra Toribio POUR, MD  estradiol  (ESTRACE ) 1 MG tablet TAKE 1 TABLET BY MOUTH EVERY DAY 03/27/24  Yes Chandra Toribio POUR, MD  fenofibrate  (TRICOR ) 145 MG tablet TAKE 1 TABLET (145 MG TOTAL) BY MOUTH DAILY. REPEAT A FASTING LIPID PANEL FOLLOWING COMPLETION. 01/09/24  Yes Chandra Toribio POUR, MD  icosapent  Ethyl (VASCEPA ) 1 g capsule TAKE 2 CAPSULES BY MOUTH 2 TIMES DAILY. 10/16/23  Yes Chandra Toribio POUR, MD  levothyroxine  (SYNTHROID ) 112 MCG tablet TAKE 1 TABLET BY MOUTH EVERY DAY 03/13/24  Yes Clapp, Kara F, PA-C  naltrexone  (DEPADE) 50 MG tablet Take 0.5 tablets (25 mg total) by mouth in the morning and at bedtime. 04/01/24  Yes Chandra Toribio POUR, MD  omeprazole  (PRILOSEC) 20 MG capsule Take 1 capsule (20 mg total) by mouth daily before breakfast. 05/14/24  Yes Craig Palma R, PA-C  EPINEPHrine  (EPIPEN  2-PAK) 0.3 mg/0.3 mL IJ SOAJ injection Inject 0.3 mg into the muscle as needed for anaphylaxis. 01/31/24   Chandra Toribio POUR, MD  meloxicam (MOBIC) 15 MG tablet Take 15 mg by mouth daily as needed. 04/01/24   [provider]  phentermine  37.5 MG capsule Take 1 capsule (37.5 mg total) by mouth every morning. 07/07/23   Chandra Toribio POUR, MD    Current Outpatient Medications  Medication Sig Dispense Refill   amLODipine  (NORVASC ) 5 MG tablet Take 1 tablet (5 mg total) by mouth daily. 90 tablet 1   buPROPion  (WELLBUTRIN  XL) 150 MG 24 hr tablet TAKE 1 TABLET BY MOUTH EVERY DAY 90 tablet 1   citalopram  (CELEXA ) 20 MG tablet Take 1 tablet (20 mg total) by mouth daily. 90 tablet 1   estradiol  (ESTRACE ) 1 MG tablet TAKE 1 TABLET BY MOUTH EVERY DAY 90 tablet 1   fenofibrate  (TRICOR ) 145 MG tablet TAKE 1 TABLET (145 MG  TOTAL) BY MOUTH DAILY. REPEAT A FASTING LIPID PANEL FOLLOWING COMPLETION. 90 tablet 1   icosapent  Ethyl (VASCEPA ) 1 g capsule TAKE 2 CAPSULES BY MOUTH 2 TIMES DAILY. 360 capsule 1   levothyroxine  (SYNTHROID ) 112 MCG tablet TAKE 1 TABLET BY MOUTH EVERY DAY 90 tablet 1   naltrexone  (DEPADE) 50 MG tablet Take 0.5 tablets (25 mg total) by mouth in the morning and at bedtime. 30 tablet 2   omeprazole  (PRILOSEC) 20 MG capsule Take 1 capsule (20 mg total) by mouth daily before breakfast. 30 capsule 2   EPINEPHrine  (EPIPEN  2-PAK) 0.3 mg/0.3 mL IJ SOAJ injection Inject 0.3 mg into the muscle as needed for anaphylaxis. 1 each 1   meloxicam (MOBIC) 15 MG tablet Take 15 mg by mouth daily as needed.     phentermine  37.5 MG capsule Take 1 capsule (37.5 mg total) by mouth every morning. 30 capsule 1   Current Facility-Administered Medications  Medication Dose Route Frequency Provider Last Rate Last Admin   0.9 %  sodium chloride  infusion  500 mL Intravenous Once Federico Rosario BROCKS, MD        Allergies as of 05/22/2024 - Review Complete 05/22/2024  Allergen Reaction Noted   Allegra [fexofenadine] Rash 08/06/2014    Family History  Problem Relation Age of Onset   Diabetes Father    Heart disease Father    Hypertension Father    Cancer Maternal Grandmother    Colon cancer Paternal Grandmother    Ulcerative colitis Paternal Grandmother    Colon polyps Neg Hx    Esophageal cancer Neg Hx    Stomach cancer Neg Hx    Rectal cancer Neg Hx     Social History   Socioeconomic History   Marital status: Married    Spouse name: Not on file   Number of children: Not on file   Years of education: Not on file   Highest education level: Associate degree: occupational, scientist, product/process development, or vocational program  Occupational History   Not on file  Tobacco Use   Smoking status: Former    Current packs/day: 1.00    Average packs/day: 1 pack/day for 10.0 years (10.0 ttl pk-yrs)    Types: Cigarettes   Smokeless tobacco:  Never   Tobacco comments:    stopped 07/16/2018  Vaping Use   Vaping status: Never Used  Substance and Sexual Activity   Alcohol use: Not Currently    Alcohol/week: 6.0 standard drinks of alcohol    Types: 6 Standard drinks or equivalent per week    Comment: weekends-- 3 to 4- not currently as of 05/16/24   Drug use: Never   Sexual activity: Not Currently    Birth control/protection: Post-menopausal, Surgical  Other Topics Concern   Not on file  Social History Narrative  Not on file   Social Drivers of Health   Tobacco Use: Medium Risk (05/22/2024)   Patient History    Smoking Tobacco Use: Former    Smokeless Tobacco Use: Never    Passive Exposure: Not on file  Financial Resource Strain: Low Risk (01/28/2024)   Overall Financial Resource Strain (CARDIA)    Difficulty of Paying Living Expenses: Not very hard  Food Insecurity: No Food Insecurity (01/28/2024)   Epic    Worried About Programme Researcher, Broadcasting/film/video in the Last Year: Never true    Ran Out of Food in the Last Year: Never true  Transportation Needs: No Transportation Needs (01/28/2024)   Epic    Lack of Transportation (Medical): No    Lack of Transportation (Non-Medical): No  Physical Activity: Insufficiently Active (01/28/2024)   Exercise Vital Sign    Days of Exercise per Week: 3 days    Minutes of Exercise per Session: 30 min  Stress: Stress Concern Present (01/28/2024)   [employer] of Occupational Health - Occupational Stress Questionnaire    Feeling of Stress: To some extent  Social Connections: Moderately Isolated (01/28/2024)   Social Connection and Isolation Panel    Frequency of Communication with Friends and Family: More than three times a week    Frequency of Social Gatherings with Friends and Family: More than three times a week    Attends Religious Services: Never    Database Administrator or Organizations: No    Attends Engineer, Structural: Not on file    Marital Status: Married  Catering Manager  Violence: Not on file  Depression (PHQ2-9): Low Risk (04/01/2024)   Depression (PHQ2-9)    PHQ-2 Score: 2  Alcohol Screen: Low Risk (01/28/2024)   Alcohol Screen    Last Alcohol Screening Score (AUDIT): 6  Housing: Unknown (01/28/2024)   Epic    Unable to Pay for Housing in the Last Year: No    Number of Times Moved in the Last Year: Not on file    Homeless in the Last Year: No  Utilities: Not on file  Health Literacy: Not on file    Physical Exam: Vital signs in last 24 hours: BP 110/77   Pulse 61   Temp (!) 97.4 F (36.3 C) (Temporal)   Resp 16   Ht 5' 3 (1.6 m)   Wt 182 lb (82.6 kg)   LMP 01/23/2015   SpO2 98%   BMI 32.24 kg/m  GEN: NAD EYE: Sclerae anicteric ENT: MMM CV: Non-tachycardic Pulm: No increased WOB GI: Soft NEURO:  Alert & Oriented   Estefana Kidney, MD [facility]   05/22/2024 8:11 AM  "

## 2024-05-22 NOTE — Progress Notes (Signed)
 Pt's states no medical or surgical changes since previsit or office visit.

## 2024-05-23 ENCOUNTER — Telehealth: Payer: Self-pay | Admitting: *Deleted

## 2024-05-23 NOTE — Telephone Encounter (Signed)
 Post procedure follow up call placed, no answer and left VM.

## 2024-05-24 ENCOUNTER — Ambulatory Visit: Payer: Self-pay | Admitting: Internal Medicine

## 2024-05-24 LAB — SURGICAL PATHOLOGY

## 2024-05-27 ENCOUNTER — Ambulatory Visit (HOSPITAL_COMMUNITY)
Admission: RE | Admit: 2024-05-27 | Discharge: 2024-05-27 | Disposition: A | Discharge status: Home / Self Care | Source: Ambulatory Visit | Attending: Physician Assistant | Admitting: Physician Assistant

## 2024-05-27 DIAGNOSIS — R7989 Other specified abnormal findings of blood chemistry: Secondary | ICD-10-CM | POA: Diagnosis present

## 2024-05-27 DIAGNOSIS — R1310 Dysphagia, unspecified: Secondary | ICD-10-CM | POA: Diagnosis present

## 2024-05-27 DIAGNOSIS — R195 Other fecal abnormalities: Secondary | ICD-10-CM | POA: Insufficient documentation

## 2024-06-04 ENCOUNTER — Ambulatory Visit: Admitting: Family Medicine

## 2024-06-14 ENCOUNTER — Other Ambulatory Visit: Payer: Self-pay | Admitting: Family Medicine

## 2024-06-17 ENCOUNTER — Ambulatory Visit: Admitting: Family Medicine

## 2024-07-01 ENCOUNTER — Ambulatory Visit: Admitting: Family Medicine

## 2024-08-02 ENCOUNTER — Ambulatory Visit: Admitting: Internal Medicine
